# Patient Record
Sex: Female | Born: 1975 | Race: White | Hispanic: No | Marital: Married | Smoking: Never smoker
Health system: Southern US, Community
[De-identification: ages and names within clinical notes are randomized; demographics above are authoritative.]

## PROBLEM LIST (undated history)

## (undated) DIAGNOSIS — H409 Unspecified glaucoma: Secondary | ICD-10-CM

## (undated) DIAGNOSIS — D649 Anemia, unspecified: Secondary | ICD-10-CM

## (undated) HISTORY — DX: Unspecified glaucoma: H40.9

## (undated) HISTORY — PX: KNEE SURGERY: SHX244

---

## 1998-01-23 ENCOUNTER — Other Ambulatory Visit: Admission: RE | Admit: 1998-01-23 | Discharge: 1998-01-23 | Payer: Self-pay | Admitting: Obstetrics and Gynecology

## 1999-02-22 ENCOUNTER — Other Ambulatory Visit: Admission: RE | Admit: 1999-02-22 | Discharge: 1999-02-22 | Payer: Self-pay | Admitting: Family Medicine

## 2000-04-02 ENCOUNTER — Other Ambulatory Visit: Admission: RE | Admit: 2000-04-02 | Discharge: 2000-04-02 | Payer: Self-pay | Admitting: Obstetrics and Gynecology

## 2000-12-12 ENCOUNTER — Inpatient Hospital Stay (HOSPITAL_COMMUNITY): Admission: AD | Admit: 2000-12-12 | Discharge: 2000-12-15 | Payer: Self-pay | Admitting: Obstetrics and Gynecology

## 2001-11-17 ENCOUNTER — Other Ambulatory Visit: Admission: RE | Admit: 2001-11-17 | Discharge: 2001-11-17 | Payer: Self-pay | Admitting: Obstetrics and Gynecology

## 2002-12-30 ENCOUNTER — Other Ambulatory Visit: Admission: RE | Admit: 2002-12-30 | Discharge: 2002-12-30 | Payer: Self-pay | Admitting: Obstetrics and Gynecology

## 2003-03-27 ENCOUNTER — Inpatient Hospital Stay (HOSPITAL_COMMUNITY): Admission: AD | Admit: 2003-03-27 | Discharge: 2003-03-27 | Payer: Self-pay | Admitting: Obstetrics and Gynecology

## 2003-08-03 ENCOUNTER — Inpatient Hospital Stay (HOSPITAL_COMMUNITY): Admission: AD | Admit: 2003-08-03 | Discharge: 2003-08-03 | Payer: Self-pay | Admitting: Obstetrics & Gynecology

## 2003-08-07 ENCOUNTER — Encounter: Admission: RE | Admit: 2003-08-07 | Discharge: 2003-08-07 | Payer: Self-pay | Admitting: Obstetrics & Gynecology

## 2003-08-17 ENCOUNTER — Inpatient Hospital Stay (HOSPITAL_COMMUNITY): Admission: AD | Admit: 2003-08-17 | Discharge: 2003-08-20 | Payer: Self-pay | Admitting: Obstetrics and Gynecology

## 2004-09-28 IMAGING — US US OB COMP EACH ADDL GEST+14 WKS
1 series · 6 of 6 positions shown · non-contrast
Comparison: none

CLINICAL DATA: 19 week twins with vaginal bleeding.

[Series 1: unknown · 0.16mm/px · 6 of 6 slices shown]
[im 1/6]
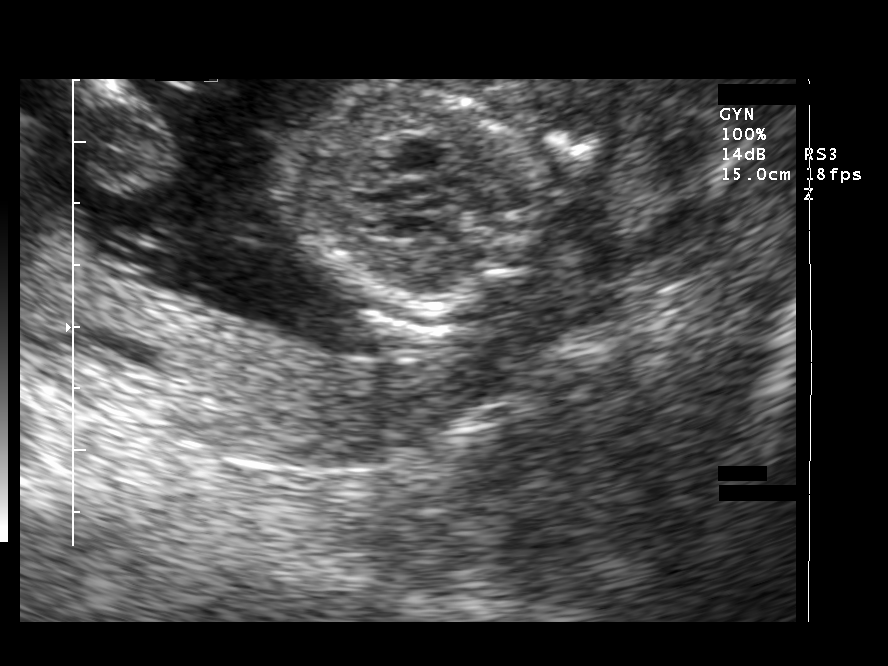
[im 2/6]
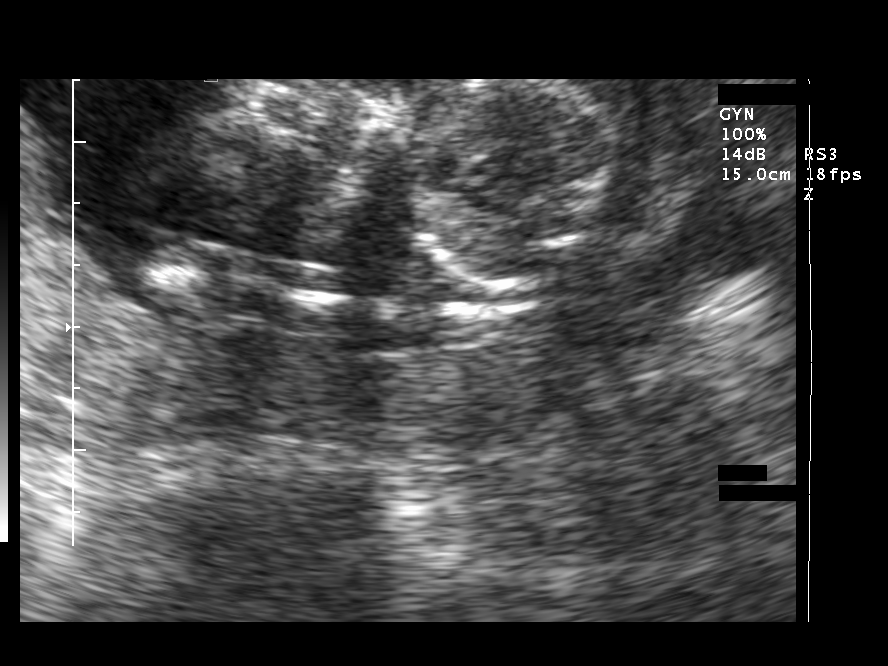
[im 3/6]
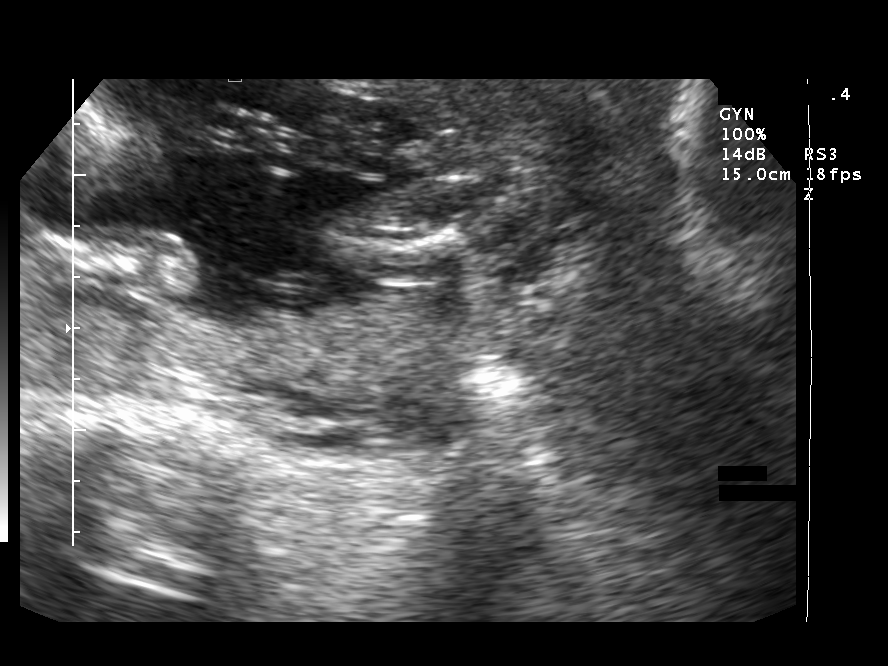
[im 4/6]
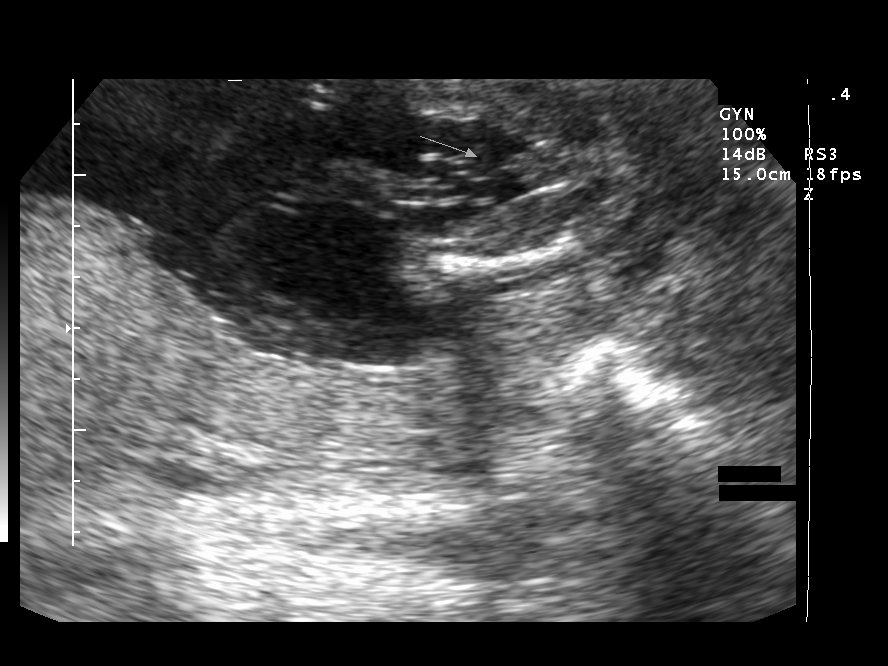
[im 5/6]
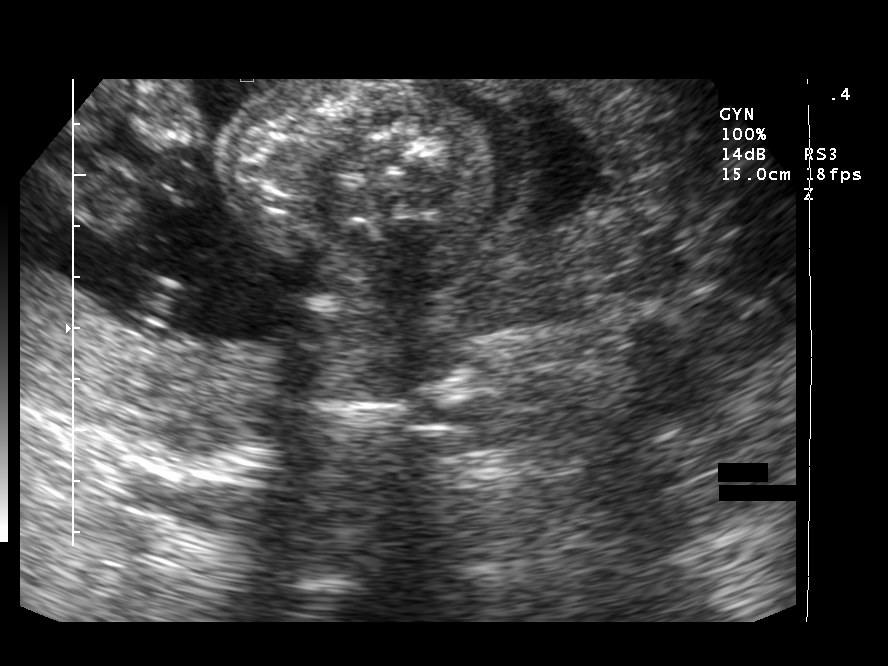
[im 6/6]
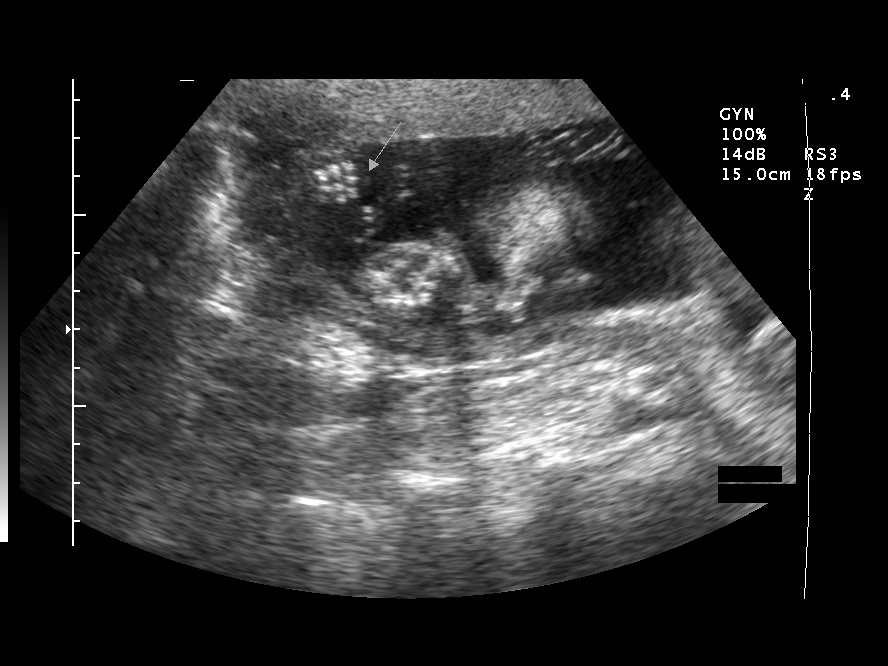

[6 of 6 positions shown; findings below may reference images not displayed]

TWIN OBSTETRICAL ULTRASOUND WITH TRANSVAGINAL EVALUATION
A living twin gestation is present.  A separating membrane is seen.

TWIN A:
Heart Rate:    168 bpm
Movement:  Yes
Breathing:  No
Presentation:  Transverse with head on maternal right, inferior in location.
Placental Location:  Posterior
Comment:  Noted is posterior asymmetric complete placenta previa.
Grade:  I
Previa:  Complete
Amniotic Fluid (Subjective):    Normal  Amniotic Fluid (Objective):   4.4 cm vertical pocket.

FETAL BIOMETRY (TWIN A)
BPD:  4.4 cm    19W  1D
HC:  16.8 cm    19W  3D
AC:  15.1 cm  20W  2D
FL:  3.0 cm  19W  1D
MEAN GA:  19W  4D

FETAL ANATOMY (TWIN A)
Lateral Ventricles:  Visualized 
Thalami/CSP:  Visualized 
Posterior Fossa:  Visualized 
Nuchal Region:  Visualized 
Spine:  Visualized 
4 Chamber Heart on L:  Visualized 
Stomach on Left:  Visualized 
3 Vessel Cord:  Visualized 
Cord Insertion Site:  Visualized    
Kidneys:  Visualized 
Bladder:  Visualized 
Extremities:  Visualized 

Additional Anatomy Visualized:  LVOT, RVOT, upper lip, orbits, profile, diaphragm, heel, 5th digit, ductal arch, aortic arch.

TWIN B:
Heart Rate:  133 bpm
Movement:  Yes  
Breathing:  No
Presentation:  Transverse in presentation with the head on the maternal right, superior in location.
Placental Location:  Anterior
Grade:  I
Previa:  No
Amniotic Fluid (Subjective):  Normal.
Amniotic Fluid (Objective):   3.7 cm vertical pocket

FETAL BIOMETRY (TWIN B)
BPD:  4.5 cm    19W  3D
HC:  16.9 cm    19W  4D
AC:  14.4 cm  19W  5D
FL:  3.0 cm  19W  2D
MEAN GA:  19W  4D

FETAL ANATOMY (TWIN B)
Lateral Ventricles:  Visualized 
Thalami/CSP:  Visualized 
Posterior Fossa:  Visualized 
Nuchal Region:  Visualized 
Spine:  Limited (transverse views only visualized)
4 Chamber Heart on L:  Visualized 
Stomach on Left:  Visualized 
3 Vessel Cord:  Visualized 
Cord Insertion Site:  Visualized   
Kidneys:  Visualized 
Bladder:  Visualized 
Extremities  Visualized 

Additional Anatomy Visualized:  LVOT, RVOT, upper lip, orbits, profile, diaphragm, heel, 5th digit, ductal arch, aortic arch.

Maternal Findings:
Cervix:  4.9 cm transvaginally

IMPRESSION 
Diamniotic dichorionic twin gestation with twin A demonstrating an estimated gestational age by ultrasound of 19 weeks 4 days and twin B of 19 weeks 4 days.  Correlation with assigned gestational age by initial ultrasound of 19 weeks 0 days suggests appropriate and concordant growth.
No focal fetal abnormalities are noted with a good anatomic exam possible on both twins.  Longitudinal views of the fetal spine could not be visualized on twin B due to the positioning, but transverse views appeared within normal limits and with a normal posterior fossa, unless the patient has an elevated AFP value, reassessment is unlikely to be of much diagnostic utility. 
Current posterior asymmetric complete placenta previa associated with twin A. Follow-up at greater than 28 weeks would be recommended for full assessment.

## 2005-02-18 IMAGING — US US OB LIMITED
1 series · 4 of 4 positions shown · non-contrast
Comparison: none

CLINICAL DATA: Known twins.  Question presentation.

 LIMITED OBSTETRICAL ULTRASOUND
 Twin A is located vertex in presentation on the maternal left and demonstrates a cardiac rate of 140 bpm.  Twin B is located transverse in presentation with head on the maternal right and demonstrates a cardiac rate of 146 bpm.

[Series 1: us ob limited · 4 of 4 slices shown]
[im 1/4]
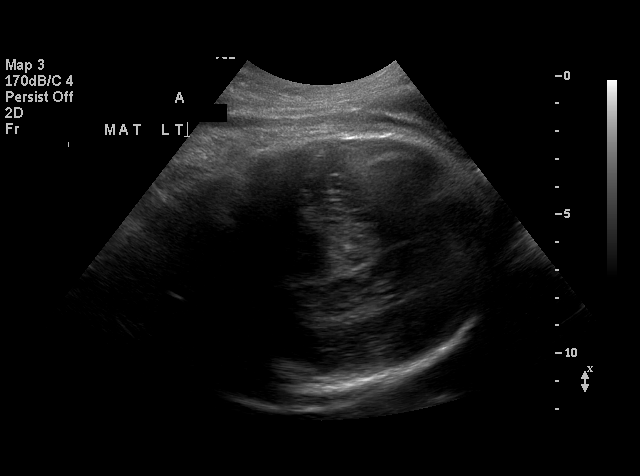
[im 2/4]
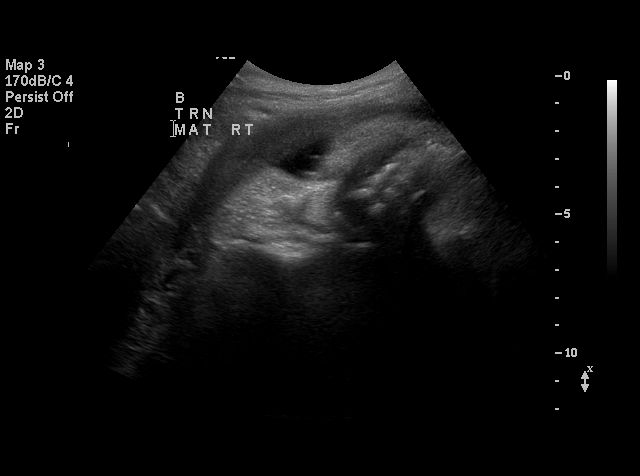
[im 3/4]
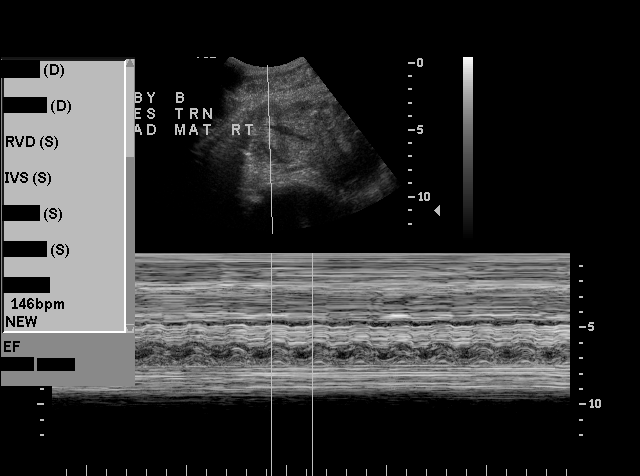
[im 4/4]
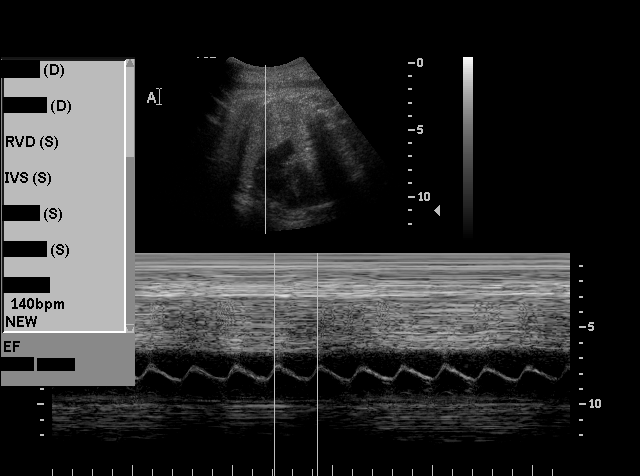

[4 of 4 positions shown; findings below may reference images not displayed]

IMPRESSION: Twin presentation as described above.

## 2007-05-20 HISTORY — PX: TUBAL LIGATION: SHX77

## 2010-06-09 ENCOUNTER — Encounter: Payer: Self-pay | Admitting: Obstetrics & Gynecology

## 2012-09-09 ENCOUNTER — Other Ambulatory Visit: Payer: Self-pay | Admitting: Obstetrics and Gynecology

## 2013-10-27 ENCOUNTER — Other Ambulatory Visit: Payer: Self-pay | Admitting: Obstetrics and Gynecology

## 2013-10-28 LAB — CYTOLOGY - PAP

## 2016-02-24 DIAGNOSIS — J209 Acute bronchitis, unspecified: Secondary | ICD-10-CM | POA: Diagnosis not present

## 2016-06-12 DIAGNOSIS — J111 Influenza due to unidentified influenza virus with other respiratory manifestations: Secondary | ICD-10-CM | POA: Diagnosis not present

## 2016-06-12 DIAGNOSIS — J209 Acute bronchitis, unspecified: Secondary | ICD-10-CM | POA: Diagnosis not present

## 2016-06-20 DIAGNOSIS — J189 Pneumonia, unspecified organism: Secondary | ICD-10-CM | POA: Diagnosis not present

## 2016-10-01 DIAGNOSIS — H40053 Ocular hypertension, bilateral: Secondary | ICD-10-CM | POA: Diagnosis not present

## 2016-10-01 DIAGNOSIS — H5213 Myopia, bilateral: Secondary | ICD-10-CM | POA: Diagnosis not present

## 2017-03-31 DIAGNOSIS — I868 Varicose veins of other specified sites: Secondary | ICD-10-CM | POA: Diagnosis not present

## 2017-03-31 DIAGNOSIS — E559 Vitamin D deficiency, unspecified: Secondary | ICD-10-CM | POA: Diagnosis not present

## 2017-03-31 DIAGNOSIS — Z1389 Encounter for screening for other disorder: Secondary | ICD-10-CM | POA: Diagnosis not present

## 2017-03-31 DIAGNOSIS — Z23 Encounter for immunization: Secondary | ICD-10-CM | POA: Diagnosis not present

## 2017-03-31 DIAGNOSIS — K219 Gastro-esophageal reflux disease without esophagitis: Secondary | ICD-10-CM | POA: Diagnosis not present

## 2017-03-31 DIAGNOSIS — Z0000 Encounter for general adult medical examination without abnormal findings: Secondary | ICD-10-CM | POA: Diagnosis not present

## 2017-04-07 DIAGNOSIS — Z124 Encounter for screening for malignant neoplasm of cervix: Secondary | ICD-10-CM | POA: Diagnosis not present

## 2017-04-07 DIAGNOSIS — Z01419 Encounter for gynecological examination (general) (routine) without abnormal findings: Secondary | ICD-10-CM | POA: Diagnosis not present

## 2017-04-07 DIAGNOSIS — Z6828 Body mass index (BMI) 28.0-28.9, adult: Secondary | ICD-10-CM | POA: Diagnosis not present

## 2017-04-07 DIAGNOSIS — Z1231 Encounter for screening mammogram for malignant neoplasm of breast: Secondary | ICD-10-CM | POA: Diagnosis not present

## 2017-04-13 ENCOUNTER — Other Ambulatory Visit: Payer: Self-pay | Admitting: Obstetrics and Gynecology

## 2017-04-13 DIAGNOSIS — R928 Other abnormal and inconclusive findings on diagnostic imaging of breast: Secondary | ICD-10-CM

## 2017-04-20 ENCOUNTER — Other Ambulatory Visit: Payer: Self-pay | Admitting: Obstetrics and Gynecology

## 2017-04-20 ENCOUNTER — Ambulatory Visit
Admission: RE | Admit: 2017-04-20 | Discharge: 2017-04-20 | Disposition: A | Discharge status: Home / Self Care | Payer: BLUE CROSS/BLUE SHIELD | Source: Ambulatory Visit | Attending: Obstetrics and Gynecology | Admitting: Obstetrics and Gynecology

## 2017-04-20 ENCOUNTER — Ambulatory Visit: Payer: Self-pay

## 2017-04-20 DIAGNOSIS — R928 Other abnormal and inconclusive findings on diagnostic imaging of breast: Secondary | ICD-10-CM | POA: Diagnosis not present

## 2017-04-20 DIAGNOSIS — N6489 Other specified disorders of breast: Secondary | ICD-10-CM

## 2017-05-25 DIAGNOSIS — J324 Chronic pansinusitis: Secondary | ICD-10-CM | POA: Diagnosis not present

## 2017-10-20 ENCOUNTER — Ambulatory Visit
Admission: RE | Admit: 2017-10-20 | Discharge: 2017-10-20 | Disposition: A | Discharge status: Home / Self Care | Payer: BLUE CROSS/BLUE SHIELD | Source: Ambulatory Visit | Attending: Obstetrics and Gynecology | Admitting: Obstetrics and Gynecology

## 2017-10-20 ENCOUNTER — Other Ambulatory Visit: Payer: Self-pay | Admitting: Obstetrics and Gynecology

## 2017-10-20 ENCOUNTER — Encounter: Payer: Self-pay | Admitting: Radiology

## 2017-10-20 DIAGNOSIS — N6489 Other specified disorders of breast: Secondary | ICD-10-CM

## 2017-10-20 DIAGNOSIS — N6310 Unspecified lump in the right breast, unspecified quadrant: Secondary | ICD-10-CM | POA: Diagnosis not present

## 2017-10-20 DIAGNOSIS — N6320 Unspecified lump in the left breast, unspecified quadrant: Secondary | ICD-10-CM | POA: Diagnosis not present

## 2017-10-20 DIAGNOSIS — R928 Other abnormal and inconclusive findings on diagnostic imaging of breast: Secondary | ICD-10-CM | POA: Diagnosis not present

## 2017-11-04 DIAGNOSIS — H5213 Myopia, bilateral: Secondary | ICD-10-CM | POA: Diagnosis not present

## 2017-11-04 DIAGNOSIS — H40053 Ocular hypertension, bilateral: Secondary | ICD-10-CM | POA: Diagnosis not present

## 2018-04-13 DIAGNOSIS — J069 Acute upper respiratory infection, unspecified: Secondary | ICD-10-CM | POA: Diagnosis not present

## 2018-04-13 DIAGNOSIS — J111 Influenza due to unidentified influenza virus with other respiratory manifestations: Secondary | ICD-10-CM | POA: Diagnosis not present

## 2020-10-09 DIAGNOSIS — H40023 Open angle with borderline findings, high risk, bilateral: Secondary | ICD-10-CM | POA: Diagnosis not present

## 2021-10-29 DIAGNOSIS — H40023 Open angle with borderline findings, high risk, bilateral: Secondary | ICD-10-CM | POA: Diagnosis not present

## 2022-05-30 DIAGNOSIS — E559 Vitamin D deficiency, unspecified: Secondary | ICD-10-CM | POA: Diagnosis not present

## 2022-05-30 DIAGNOSIS — Z0000 Encounter for general adult medical examination without abnormal findings: Secondary | ICD-10-CM | POA: Diagnosis not present

## 2022-05-30 DIAGNOSIS — E611 Iron deficiency: Secondary | ICD-10-CM | POA: Diagnosis not present

## 2022-06-10 ENCOUNTER — Encounter (HOSPITAL_COMMUNITY): Payer: Self-pay | Admitting: Internal Medicine

## 2022-06-10 ENCOUNTER — Other Ambulatory Visit: Payer: Self-pay | Admitting: Internal Medicine

## 2022-06-10 ENCOUNTER — Inpatient Hospital Stay: Admission: RE | Admit: 2022-06-10 | Payer: BC Managed Care – PPO | Source: Ambulatory Visit

## 2022-06-10 DIAGNOSIS — Z23 Encounter for immunization: Secondary | ICD-10-CM | POA: Diagnosis not present

## 2022-06-10 DIAGNOSIS — Z1331 Encounter for screening for depression: Secondary | ICD-10-CM | POA: Diagnosis not present

## 2022-06-10 DIAGNOSIS — R109 Unspecified abdominal pain: Secondary | ICD-10-CM

## 2022-06-10 DIAGNOSIS — Z0000 Encounter for general adult medical examination without abnormal findings: Secondary | ICD-10-CM | POA: Diagnosis not present

## 2022-06-10 DIAGNOSIS — E611 Iron deficiency: Secondary | ICD-10-CM | POA: Diagnosis not present

## 2022-06-12 ENCOUNTER — Ambulatory Visit
Admission: RE | Admit: 2022-06-12 | Discharge: 2022-06-12 | Disposition: A | Discharge status: Home / Self Care | Payer: BC Managed Care – PPO | Source: Ambulatory Visit | Attending: Internal Medicine | Admitting: Internal Medicine

## 2022-06-12 DIAGNOSIS — R109 Unspecified abdominal pain: Secondary | ICD-10-CM

## 2022-06-12 MED ORDER — IOPAMIDOL (ISOVUE-300) INJECTION 61%
100.0000 mL | Freq: Once | INTRAVENOUS | Status: AC | PRN
  Administered 2022-06-12: 100 mL via INTRAVENOUS

## 2022-06-19 DIAGNOSIS — D259 Leiomyoma of uterus, unspecified: Secondary | ICD-10-CM | POA: Diagnosis not present

## 2022-06-19 DIAGNOSIS — Z124 Encounter for screening for malignant neoplasm of cervix: Secondary | ICD-10-CM | POA: Diagnosis not present

## 2022-07-23 DIAGNOSIS — D259 Leiomyoma of uterus, unspecified: Secondary | ICD-10-CM | POA: Diagnosis not present

## 2022-07-23 DIAGNOSIS — N920 Excessive and frequent menstruation with regular cycle: Secondary | ICD-10-CM | POA: Diagnosis not present

## 2022-07-23 DIAGNOSIS — Z1231 Encounter for screening mammogram for malignant neoplasm of breast: Secondary | ICD-10-CM | POA: Diagnosis not present

## 2022-09-22 ENCOUNTER — Other Ambulatory Visit: Payer: Self-pay | Admitting: Obstetrics & Gynecology

## 2022-10-07 NOTE — Pre-Procedure Instructions (Signed)
Surgical Instructions    Your procedure is scheduled on Oct 16, 2022.  Report to [facility] Main Entrance "A" at 5:30 A.M., then check in with the Admitting office.  Call this number if you have problems the morning of surgery:  [phone]  If you have any questions prior to your surgery date call [phone]: Open Monday-Friday 8am-4pm If you experience any cold or flu symptoms such as cough, fever, chills, shortness of breath, etc. between now and your scheduled surgery, please notify us at the above number.     Remember:  Do not eat after midnight the night before your surgery  You may drink clear liquids until 4:30 AM the morning of your surgery.   Clear liquids allowed are: Water, Non-Citrus Juices (without pulp), Carbonated Beverages, Clear Tea, Black Coffee Only (NO MILK, CREAM OR POWDERED CREAMER of any kind), and Gatorade.     Take these medicines the morning of surgery with A SIP OF WATER:  MYFEMBREE   acetaminophen (TYLENOL) - may take if needed    As of today, STOP taking any Aspirin (unless otherwise instructed by your surgeon) Aleve, Naproxen, Ibuprofen, Motrin, Advil, Goody's, BC's, all herbal medications, fish oil, and all vitamins.                     Do NOT Smoke (Tobacco/Vaping) for 24 hours prior to your procedure.  If you use a CPAP at night, you may bring your mask/headgear for your overnight stay.   Contacts, glasses, piercing's, hearing aid's, dentures or partials may not be worn into surgery, please bring cases for these belongings.    For patients admitted to the hospital, discharge time will be determined by your treatment team.   Patients discharged the day of surgery will not be allowed to drive home, and someone needs to stay with them for 24 hours.  SURGICAL WAITING ROOM VISITATION Patients having surgery or a procedure may have no more than 2 support people in the waiting area - these visitors may rotate.   Children under the age of 3 must  have an adult with them who is not the patient. If the patient needs to stay at the hospital during part of their recovery, the visitor guidelines for inpatient rooms apply. Pre-op nurse will coordinate an appropriate time for 1 support person to accompany patient in pre-op.  This support person may not rotate.   Please refer to the [facility] website for the visitor guidelines for Inpatients (after your surgery is over and you are in a regular room).    Special instructions:   [facility]- Preparing For Surgery  Before surgery, you can play an important role. Because skin is not sterile, your skin needs to be as free of germs as possible. You can reduce the number of germs on your skin by washing with CHG (chlorahexidine gluconate) Soap before surgery.  CHG is an antiseptic cleaner which kills germs and bonds with the skin to continue killing germs even after washing.    Oral Hygiene is also important to reduce your risk of infection.  Remember - BRUSH YOUR TEETH THE MORNING OF SURGERY WITH YOUR REGULAR TOOTHPASTE  Please do not use if you have an allergy to CHG or antibacterial soaps. If your skin becomes reddened/irritated stop using the CHG.  Do not shave (including legs and underarms) for at least 48 hours prior to first CHG shower. It is OK to shave your face.  Please follow these instructions carefully.  Shower the NIGHT BEFORE SURGERY and the MORNING OF SURGERY  If you chose to wash your hair, wash your hair first as usual with your normal shampoo.  After you shampoo, rinse your hair and body thoroughly to remove the shampoo.  Use CHG Soap as you would any other liquid soap. You can apply CHG directly to the skin and wash gently with a scrungie or a clean washcloth.   Apply the CHG Soap to your body ONLY FROM THE NECK DOWN.  Do not use on open wounds or open sores. Avoid contact with your eyes, ears, mouth and genitals (private parts). Wash Face and genitals (private parts)  with  your normal soap.   Wash thoroughly, paying special attention to the area where your surgery will be performed.  Thoroughly rinse your body with warm water from the neck down.  DO NOT shower/wash with your normal soap after using and rinsing off the CHG Soap.  Pat yourself dry with a CLEAN TOWEL.  Wear CLEAN PAJAMAS to bed the night before surgery  Place CLEAN SHEETS on your bed the night before your surgery  DO NOT SLEEP WITH PETS.   Day of Surgery: Take a shower with CHG soap. Do not wear jewelry or makeup Do not wear lotions, powders, perfumes/colognes, or deodorant. Do not shave 48 hours prior to surgery.  Men may shave face and neck. Do not bring valuables to the hospital.  [facility] is not responsible for any belongings or valuables. Do not wear nail polish, gel polish, artificial nails, or any other type of covering on natural nails (fingers and toes) If you have artificial nails or gel coating that need to be removed by a nail salon, please have this removed prior to surgery. Artificial nails or gel coating may interfere with anesthesia's ability to adequately monitor your vital signs.  Wear Clean/Comfortable clothing the morning of surgery Remember to brush your teeth WITH YOUR REGULAR TOOTHPASTE.   Please read over the following fact sheets that you were given.    If you received a COVID test during your pre-op visit  it is requested that you wear a mask when out in public, stay away from anyone that may not be feeling well and notify your surgeon if you develop symptoms. If you have been in contact with anyone that has tested positive in the last 10 days please notify you surgeon.

## 2022-10-08 ENCOUNTER — Encounter (HOSPITAL_COMMUNITY)
Admission: RE | Admit: 2022-10-08 | Discharge: 2022-10-08 | Disposition: A | Discharge status: Home / Self Care | Payer: BC Managed Care – PPO | Source: Ambulatory Visit | Attending: Obstetrics & Gynecology | Admitting: Obstetrics & Gynecology

## 2022-10-08 ENCOUNTER — Encounter (HOSPITAL_COMMUNITY): Payer: Self-pay

## 2022-10-08 ENCOUNTER — Other Ambulatory Visit: Payer: Self-pay

## 2022-10-08 DIAGNOSIS — Z01812 Encounter for preprocedural laboratory examination: Secondary | ICD-10-CM | POA: Insufficient documentation

## 2022-10-08 HISTORY — DX: Anemia, unspecified: D64.9

## 2022-10-08 LAB — BASIC METABOLIC PANEL
Anion gap: 8 (ref 5–15)
BUN: 15 mg/dL (ref 6–20)
CO2: 25 mmol/L (ref 22–32)
Calcium: 9.9 mg/dL (ref 8.9–10.3)
Chloride: 104 mmol/L (ref 98–111)
Creatinine, Ser: 0.43 mg/dL — ABNORMAL LOW (ref 0.44–1.00)
GFR, Estimated: >60 mL/min (ref >60)
Glucose, Bld: 101 mg/dL — ABNORMAL HIGH (ref 70–99)
Potassium: 4.2 mmol/L (ref 3.5–5.1)
Sodium: 137 mmol/L (ref 135–145)

## 2022-10-08 LAB — CBC
HCT: 39.2 % (ref 36.0–46.0)
Hemoglobin: 12.6 g/dL (ref 12.0–15.0)
MCH: 29.4 pg (ref 26.0–34.0)
MCHC: 32.1 g/dL (ref 30.0–36.0)
MCV: 91.6 fL (ref 80.0–100.0)
Platelets: 277 10*3/uL (ref 150–400)
RBC: 4.28 MIL/uL (ref 3.87–5.11)
RDW: 12.6 % (ref 11.5–15.5)
WBC: 5.9 10*3/uL (ref 4.0–10.5)
nRBC: 0 % (ref 0.0–0.2)

## 2022-10-08 LAB — TYPE AND SCREEN
ABO/RH(D): A POS
Antibody Screen: NEGATIVE

## 2022-10-08 NOTE — Progress Notes (Signed)
PCP - Dr. Rodrigo Ran Cardiologist - Denies  PPM/ICD - Denies Device Orders - n/a Rep Notified - n/a  Chest x-ray - n/a EKG - Denies Stress Test - Denies ECHO - Denies Cardiac Cath - Denies  Sleep Study - Denies CPAP - n/a  No DM  Last dose of GLP1 agonist- n/a GLP1 instructions: n/a  Blood Thinner Instructions: n/a Aspirin Instructions: n/a  ERAS Protcol - Clear liquids until 0430 morning of surgery PRE-SURGERY Ensure or G2- n/a  COVID TEST- n/a   Anesthesia review: No   Patient denies shortness of breath, fever, cough and chest pain at PAT appointment. Pt denies any respiratory illness/infection in the last two months.   All instructions explained to the patient, with a verbal understanding of the material. Patient agrees to go over the instructions while at home for a better understanding. Patient also instructed to self quarantine after being tested for COVID-19. The opportunity to ask questions was provided.

## 2022-10-15 NOTE — H&P (Incomplete)
Kathleen Bender is an 47 y.o. female. Here for abdominal hysterectomy for large symptomatic fibroid uterus, upto her umbilicus. She has been on Myfembree that has helped with pressure changes and menorrhagia and pain.   Pt was referred by her PCP after routine exam noted abdominal mass and CT was ordered, that noted 16x10.9 cm uterus with multiple myomas, largest at 8 cm, nl ovaries. No hydronephrosis.   She says she noted some pain and discomfort and pants not getting lose despite weight loss and on further questioning informed her menses were getting heavier and painful and lasting longer. She works on her dailry farm, is very active and still able to carry out most work but sometimes has to lay down due to cramps in menses.  Prior Paps normal. We did one at visit in Feb'24, prior to that was in 2018 w/ Dr Tenny Craw who has retired and she had not found any new Gyn. No breast complaints, last mammogr 2018 and recent with our office in Feb'24 She is married.. G2P3, 1 SVD, then twins C/s, who are 26- all boys  PCP did health labs- nl.   Patient's last menstrual period was 08/19/2022 (approximate).    Past Medical History:  Diagnosis Date   Anemia    Iron Deficiency r/t fibroids    Past Surgical History:  Procedure Laterality Date   CESAREAN SECTION  2009   KNEE SURGERY     46 years old   TUBAL LIGATION  2009   No family history on file.  Social History:  reports that she has never smoked. She has never used smokeless tobacco. She reports that she does not drink alcohol and does not use drugs.  Allergies: No Known Allergies  No medications prior to admission.   Review of Systems neg   Physical Exam BP 130/78   Pulse 77   Temp 99.4 F (37.4 C)   Resp 18   Ht 5\' 7"  (1.702 m)   Wt 68.5 kg   LMP 08/19/2022 (Approximate)   SpO2 98%   BMI 23.65 kg/m   A&O x 3, no acute distress. Pleasant HEENT neg, no thyromegaly Lungs CTA bilat CV RRR, S1S2 normal Abdo soft, non tender, non  acute Extr no edema/ tenderness Pelvic Uterus enlarged 18 wks, mobile, seems slightly smaller since on Myfembree   Assessment/Plan: Very large fibroid uterus with symptoms of pain, menorrhagia. We reviewed options- Hysterectomy (TAH due to size ) or uterine fibroid embolization with some mass effect will remain since takes time to decrease size. R/b/c of both dw pt and she is desiring hysterectomy As for heavy bleeding, she was given Myfembree and as helped that and somewhat size reduction as well   Risks/complications of surgery reviewed incl infection, bleeding, damage to internal organs including bladder, bowels, ureters, blood vessels, other risks from anesthesia, VTE and delayed complications of any surgery, complications in future surgery reviewed. Also discussed neonatal complications incl difficult delivery, laceration, vacuum assistance, TTN etc. Pt understands and agrees, all concerns addressed.     Robley Fries 10/16/22, 7 AM

## 2022-10-15 NOTE — Anesthesia Preprocedure Evaluation (Addendum)
Anesthesia Evaluation  Patient identified by MRN, date of birth, ID band Patient awake    Reviewed: Allergy & Precautions, NPO status , Patient's Chart, lab work & pertinent test results  History of Anesthesia Complications Negative for: history of anesthetic complications  Airway Mallampati: II  TM Distance: >3 FB Neck ROM: Full    Dental  (+) Dental Advisory Given   Pulmonary neg pulmonary ROS   Pulmonary exam normal breath sounds clear to auscultation       Cardiovascular negative cardio ROS  Rhythm:Regular Rate:Normal     Neuro/Psych negative neurological ROS     GI/Hepatic negative GI ROS, Neg liver ROS,,,  Endo/Other  negative endocrine ROS    Renal/GU negative Renal ROS     Musculoskeletal   Abdominal   Peds  Hematology  (+) Blood dyscrasia, anemia   Anesthesia Other Findings   Reproductive/Obstetrics                             Anesthesia Physical Anesthesia Plan  ASA: 2  Anesthesia Plan: General   Post-op Pain Management: Regional block* and Tylenol PO (pre-op)*   Induction: Intravenous  PONV Risk Score and Plan: 3 and Ondansetron, Dexamethasone, Midazolam, Scopolamine patch - Pre-op and Treatment may vary due to age or medical condition  Airway Management Planned: Oral ETT  Additional Equipment:   Intra-op Plan:   Post-operative Plan: Extubation in OR  Informed Consent: I have reviewed the patients History and Physical, chart, labs and discussed the procedure including the risks, benefits and alternatives for the proposed anesthesia with the patient or authorized representative who has indicated his/her understanding and acceptance.     Dental advisory given  Plan Discussed with: Anesthesiologist and CRNA  Anesthesia Plan Comments: (Risks of general anesthesia discussed including, but not limited to, sore throat, hoarse voice, chipped/damaged teeth, injury to  vocal cords, nausea and vomiting, allergic reactions, lung infection, heart attack, stroke, and death. All questions answered. )       Anesthesia Quick Evaluation

## 2022-10-16 ENCOUNTER — Other Ambulatory Visit: Payer: Self-pay

## 2022-10-16 ENCOUNTER — Inpatient Hospital Stay (HOSPITAL_COMMUNITY): Payer: BC Managed Care – PPO | Admitting: Anesthesiology

## 2022-10-16 ENCOUNTER — Encounter (HOSPITAL_COMMUNITY): Payer: Self-pay | Admitting: Obstetrics & Gynecology

## 2022-10-16 ENCOUNTER — Encounter (HOSPITAL_COMMUNITY)
Admission: RE | Disposition: A | Discharge status: Home / Self Care | Payer: Self-pay | Source: Home / Self Care | Attending: Obstetrics & Gynecology

## 2022-10-16 ENCOUNTER — Inpatient Hospital Stay (HOSPITAL_COMMUNITY)
Admission: RE | Admit: 2022-10-16 | Discharge: 2022-10-17 | DRG: 743 | Disposition: A | Discharge status: Home / Self Care | Payer: BC Managed Care – PPO | Attending: Obstetrics & Gynecology | Admitting: Obstetrics & Gynecology

## 2022-10-16 DIAGNOSIS — D259 Leiomyoma of uterus, unspecified: Principal | ICD-10-CM | POA: Diagnosis present

## 2022-10-16 DIAGNOSIS — D219 Benign neoplasm of connective and other soft tissue, unspecified: Principal | ICD-10-CM | POA: Diagnosis present

## 2022-10-16 DIAGNOSIS — G8918 Other acute postprocedural pain: Secondary | ICD-10-CM | POA: Diagnosis not present

## 2022-10-16 DIAGNOSIS — N920 Excessive and frequent menstruation with regular cycle: Secondary | ICD-10-CM | POA: Diagnosis present

## 2022-10-16 DIAGNOSIS — Z90710 Acquired absence of both cervix and uterus: Secondary | ICD-10-CM | POA: Diagnosis present

## 2022-10-16 DIAGNOSIS — N736 Female pelvic peritoneal adhesions (postinfective): Secondary | ICD-10-CM | POA: Diagnosis not present

## 2022-10-16 HISTORY — PX: HYSTERECTOMY ABDOMINAL WITH SALPINGECTOMY: SHX6725

## 2022-10-16 SURGERY — HYSTERECTOMY, TOTAL, ABDOMINAL, WITH SALPINGECTOMY
Anesthesia: General | Site: Abdomen | Laterality: Bilateral

## 2022-10-16 MED ORDER — ROCURONIUM BROMIDE 10 MG/ML (PF) SYRINGE
PREFILLED_SYRINGE | INTRAVENOUS | Status: AC
  Filled 2022-10-16: qty 10

## 2022-10-16 MED ORDER — AMISULPRIDE (ANTIEMETIC) 5 MG/2ML IV SOLN
10.0000 mg | Freq: Once | INTRAVENOUS | Status: AC | PRN
  Administered 2022-10-16: 10 mg via INTRAVENOUS

## 2022-10-16 MED ORDER — ACETAMINOPHEN 500 MG PO TABS
ORAL_TABLET | ORAL | Status: AC
  Administered 2022-10-16: 1000 mg via ORAL
  Filled 2022-10-16: qty 2

## 2022-10-16 MED ORDER — SIMETHICONE 80 MG PO CHEW: 80.0000 mg | CHEWABLE_TABLET | Freq: Four times a day (QID) | ORAL | Status: DC | PRN

## 2022-10-16 MED ORDER — LIDOCAINE 2% (20 MG/ML) 5 ML SYRINGE
INTRAMUSCULAR | Status: DC | PRN
  Administered 2022-10-16: 80 mg via INTRAVENOUS

## 2022-10-16 MED ORDER — LACTATED RINGERS IV SOLN: INTRAVENOUS | Status: DC | PRN

## 2022-10-16 MED ORDER — POVIDONE-IODINE 10 % EX SWAB
2.0000 | Freq: Once | CUTANEOUS | Status: AC
  Administered 2022-10-16: 2 via TOPICAL

## 2022-10-16 MED ORDER — OXYCODONE HCL 5 MG PO TABS: 5.0000 mg | ORAL_TABLET | Freq: Once | ORAL | Status: DC | PRN

## 2022-10-16 MED ORDER — OXYCODONE HCL 5 MG/5ML PO SOLN: 5.0000 mg | Freq: Once | ORAL | Status: DC | PRN

## 2022-10-16 MED ORDER — KETOROLAC TROMETHAMINE 30 MG/ML IJ SOLN
30.0000 mg | Freq: Four times a day (QID) | INTRAMUSCULAR | Status: AC
  Administered 2022-10-16 – 2022-10-17 (×4): 30 mg via INTRAVENOUS
  Filled 2022-10-16 (×4): qty 1

## 2022-10-16 MED ORDER — LACTATED RINGERS IV SOLN: INTRAVENOUS | Status: DC

## 2022-10-16 MED ORDER — ONDANSETRON HCL 4 MG/2ML IJ SOLN
INTRAMUSCULAR | Status: DC | PRN
  Administered 2022-10-16: 4 mg via INTRAVENOUS

## 2022-10-16 MED ORDER — DEXAMETHASONE SODIUM PHOSPHATE 4 MG/ML IJ SOLN
INTRAMUSCULAR | Status: DC | PRN
  Administered 2022-10-16: 10 mg via INTRAVENOUS

## 2022-10-16 MED ORDER — MIDAZOLAM HCL 5 MG/5ML IJ SOLN
INTRAMUSCULAR | Status: DC | PRN
  Administered 2022-10-16: 2 mg via INTRAVENOUS

## 2022-10-16 MED ORDER — OXYCODONE HCL 5 MG PO TABS
5.0000 mg | ORAL_TABLET | ORAL | Status: DC | PRN
  Administered 2022-10-16 – 2022-10-17 (×2): 5 mg via ORAL
  Filled 2022-10-16 (×2): qty 1

## 2022-10-16 MED ORDER — SCOPOLAMINE 1 MG/3DAYS TD PT72
MEDICATED_PATCH | TRANSDERMAL | Status: AC
  Administered 2022-10-16: 1.5 mg via TRANSDERMAL
  Filled 2022-10-16: qty 1

## 2022-10-16 MED ORDER — EPHEDRINE SULFATE-NACL 50-0.9 MG/10ML-% IV SOSY
PREFILLED_SYRINGE | INTRAVENOUS | Status: DC | PRN
  Administered 2022-10-16 (×3): 5 mg via INTRAVENOUS

## 2022-10-16 MED ORDER — ONDANSETRON HCL 4 MG/2ML IJ SOLN
INTRAMUSCULAR | Status: AC
  Filled 2022-10-16: qty 2

## 2022-10-16 MED ORDER — BUPIVACAINE HCL (PF) 0.25 % IJ SOLN
INTRAMUSCULAR | Status: AC
  Filled 2022-10-16: qty 30

## 2022-10-16 MED ORDER — MIDAZOLAM HCL 2 MG/2ML IJ SOLN
INTRAMUSCULAR | Status: AC
  Filled 2022-10-16: qty 2

## 2022-10-16 MED ORDER — AMISULPRIDE (ANTIEMETIC) 5 MG/2ML IV SOLN
INTRAVENOUS | Status: AC
  Filled 2022-10-16: qty 4

## 2022-10-16 MED ORDER — DEXMEDETOMIDINE HCL IN NACL 80 MCG/20ML IV SOLN
INTRAVENOUS | Status: DC | PRN
  Administered 2022-10-16: 12 ug via INTRAVENOUS
  Administered 2022-10-16: 4 ug via INTRAVENOUS

## 2022-10-16 MED ORDER — HYDROMORPHONE HCL 1 MG/ML IJ SOLN: 0.2500 mg | INTRAMUSCULAR | Status: DC | PRN

## 2022-10-16 MED ORDER — FENTANYL CITRATE (PF) 250 MCG/5ML IJ SOLN
INTRAMUSCULAR | Status: AC
  Filled 2022-10-16: qty 5

## 2022-10-16 MED ORDER — ONDANSETRON HCL 4 MG PO TABS: 4.0000 mg | ORAL_TABLET | Freq: Four times a day (QID) | ORAL | Status: DC | PRN

## 2022-10-16 MED ORDER — BUPIVACAINE HCL (PF) 0.25 % IJ SOLN
INTRAMUSCULAR | Status: DC | PRN
  Administered 2022-10-16: 20 mL

## 2022-10-16 MED ORDER — DEXAMETHASONE SODIUM PHOSPHATE 10 MG/ML IJ SOLN
INTRAMUSCULAR | Status: AC
  Filled 2022-10-16: qty 1

## 2022-10-16 MED ORDER — HYDROMORPHONE HCL 1 MG/ML IJ SOLN: 0.5000 mg | INTRAMUSCULAR | Status: DC | PRN

## 2022-10-16 MED ORDER — CHLORHEXIDINE GLUCONATE 0.12 % MT SOLN
OROMUCOSAL | Status: AC
  Filled 2022-10-16: qty 15

## 2022-10-16 MED ORDER — KETAMINE HCL 10 MG/ML IJ SOLN
INTRAMUSCULAR | Status: DC | PRN
  Administered 2022-10-16: 30 mg via INTRAVENOUS

## 2022-10-16 MED ORDER — IBUPROFEN 600 MG PO TABS
600.0000 mg | ORAL_TABLET | Freq: Four times a day (QID) | ORAL | Status: DC
  Administered 2022-10-17: 600 mg via ORAL
  Filled 2022-10-16: qty 1

## 2022-10-16 MED ORDER — PHENYLEPHRINE 80 MCG/ML (10ML) SYRINGE FOR IV PUSH (FOR BLOOD PRESSURE SUPPORT)
PREFILLED_SYRINGE | INTRAVENOUS | Status: DC | PRN
  Administered 2022-10-16: 80 ug via INTRAVENOUS

## 2022-10-16 MED ORDER — BUPIVACAINE HCL (PF) 0.25 % IJ SOLN
INTRAMUSCULAR | Status: DC | PRN
  Administered 2022-10-16 (×2): 30 mL via PERINEURAL

## 2022-10-16 MED ORDER — 0.9 % SODIUM CHLORIDE (POUR BTL) OPTIME
TOPICAL | Status: DC | PRN
  Administered 2022-10-16: 2000 mL

## 2022-10-16 MED ORDER — FENTANYL CITRATE (PF) 100 MCG/2ML IJ SOLN
INTRAMUSCULAR | Status: DC | PRN
  Administered 2022-10-16 (×5): 50 ug via INTRAVENOUS

## 2022-10-16 MED ORDER — CEFAZOLIN SODIUM-DEXTROSE 2-4 GM/100ML-% IV SOLN
INTRAVENOUS | Status: AC
  Filled 2022-10-16: qty 100

## 2022-10-16 MED ORDER — PROPOFOL 10 MG/ML IV BOLUS
INTRAVENOUS | Status: DC | PRN
  Administered 2022-10-16: 150 mg via INTRAVENOUS

## 2022-10-16 MED ORDER — ACETAMINOPHEN 500 MG PO TABS: 1000.0000 mg | ORAL_TABLET | Freq: Once | ORAL | Status: AC

## 2022-10-16 MED ORDER — PROPOFOL 10 MG/ML IV BOLUS
INTRAVENOUS | Status: AC
  Filled 2022-10-16: qty 20

## 2022-10-16 MED ORDER — ROCURONIUM BROMIDE 10 MG/ML (PF) SYRINGE
PREFILLED_SYRINGE | INTRAVENOUS | Status: DC | PRN
  Administered 2022-10-16: 20 mg via INTRAVENOUS
  Administered 2022-10-16: 50 mg via INTRAVENOUS
  Administered 2022-10-16: 10 mg via INTRAVENOUS

## 2022-10-16 MED ORDER — KETAMINE HCL 50 MG/5ML IJ SOSY
PREFILLED_SYRINGE | INTRAMUSCULAR | Status: AC
  Filled 2022-10-16: qty 5

## 2022-10-16 MED ORDER — LIDOCAINE 2% (20 MG/ML) 5 ML SYRINGE
INTRAMUSCULAR | Status: AC
  Filled 2022-10-16: qty 5

## 2022-10-16 MED ORDER — SCOPOLAMINE 1 MG/3DAYS TD PT72
1.0000 | MEDICATED_PATCH | TRANSDERMAL | Status: DC
  Administered 2022-10-16: 1 via TRANSDERMAL

## 2022-10-16 MED ORDER — ONDANSETRON HCL 4 MG/2ML IJ SOLN
4.0000 mg | Freq: Four times a day (QID) | INTRAMUSCULAR | Status: DC | PRN
  Administered 2022-10-17: 4 mg via INTRAVENOUS
  Filled 2022-10-16: qty 2

## 2022-10-16 MED ORDER — MENTHOL 3 MG MT LOZG: 1.0000 | LOZENGE | OROMUCOSAL | Status: DC | PRN

## 2022-10-16 MED ORDER — CEFAZOLIN SODIUM-DEXTROSE 2-4 GM/100ML-% IV SOLN
2.0000 g | INTRAVENOUS | Status: AC
  Administered 2022-10-16: 2 g via INTRAVENOUS

## 2022-10-16 MED ORDER — ACETAMINOPHEN 325 MG PO TABS: 650.0000 mg | ORAL_TABLET | ORAL | Status: DC | PRN

## 2022-10-16 MED ORDER — PHENYLEPHRINE 80 MCG/ML (10ML) SYRINGE FOR IV PUSH (FOR BLOOD PRESSURE SUPPORT)
PREFILLED_SYRINGE | INTRAVENOUS | Status: AC
  Filled 2022-10-16: qty 10

## 2022-10-16 MED ORDER — SUGAMMADEX SODIUM 200 MG/2ML IV SOLN
INTRAVENOUS | Status: DC | PRN
  Administered 2022-10-16: 200 mg via INTRAVENOUS

## 2022-10-16 SURGICAL SUPPLY — 40 items
APL SKNCLS STERI-STRIP NONHPOA (GAUZE/BANDAGES/DRESSINGS) ×1
BAG COUNTER SPONGE SURGICOUNT (BAG) ×1 IMPLANT
BAG SPNG CNTER NS LX DISP (BAG) ×1
BENZOIN TINCTURE PRP APPL 2/3 (GAUZE/BANDAGES/DRESSINGS) IMPLANT
CANISTER SUCT 3000ML PPV (MISCELLANEOUS) ×1 IMPLANT
CLSR STERI-STRIP ANTIMIC 1/2X4 (GAUZE/BANDAGES/DRESSINGS) IMPLANT
DRAPE CESAREAN BIRTH W POUCH (DRAPES) ×1 IMPLANT
DRAPE WARM FLUID 44X44 (DRAPES) IMPLANT
DRSG OPSITE POSTOP 4X10 (GAUZE/BANDAGES/DRESSINGS) ×1 IMPLANT
DURAPREP 26ML APPLICATOR (WOUND CARE) ×1 IMPLANT
GAUZE 4X4 16PLY ~~LOC~~+RFID DBL (SPONGE) IMPLANT
GLOVE BIO SURGEON STRL SZ7 (GLOVE) ×1 IMPLANT
GLOVE BIOGEL PI IND STRL 6.5 (GLOVE) IMPLANT
GLOVE ECLIPSE 6.0 STRL STRAW (GLOVE) IMPLANT
GLOVE SURG UNDER POLY LF SZ7 (GLOVE) ×3 IMPLANT
GOWN STRL REUS W/ TWL LRG LVL3 (GOWN DISPOSABLE) ×3 IMPLANT
GOWN STRL REUS W/TWL LRG LVL3 (GOWN DISPOSABLE) ×5
HIBICLENS CHG 4% 4OZ BTL (MISCELLANEOUS) ×1 IMPLANT
KIT TURNOVER KIT B (KITS) ×1 IMPLANT
LIGASURE IMPACT 36 18CM CVD LR (INSTRUMENTS) IMPLANT
NDL HYPO 22X1.5 SAFETY MO (MISCELLANEOUS) ×1 IMPLANT
NEEDLE HYPO 22X1.5 SAFETY MO (MISCELLANEOUS) ×1 IMPLANT
NS IRRIG 1000ML POUR BTL (IV SOLUTION) ×1 IMPLANT
PACK ABDOMINAL GYN (CUSTOM PROCEDURE TRAY) ×1 IMPLANT
PAD ARMBOARD 7.5X6 YLW CONV (MISCELLANEOUS) ×1 IMPLANT
PAD OB MATERNITY 4.3X12.25 (PERSONAL CARE ITEMS) ×1 IMPLANT
PENCIL SMOKE EVACUATOR (MISCELLANEOUS) ×1 IMPLANT
SPONGE T-LAP 18X18 ~~LOC~~+RFID (SPONGE) ×2 IMPLANT
SUT PLAIN 2 0 XLH (SUTURE) IMPLANT
SUT PROLENE 0 CT 1 30 (SUTURE) IMPLANT
SUT VIC AB 0 CT1 18XCR BRD8 (SUTURE) ×3 IMPLANT
SUT VIC AB 0 CT1 36 (SUTURE) ×4 IMPLANT
SUT VIC AB 0 CT1 8-18 (SUTURE) ×2
SUT VIC AB 2-0 CT1 36 (SUTURE) IMPLANT
SUT VIC AB 4-0 KS 27 (SUTURE) IMPLANT
SUT VIC AB 4-0 SH 18 (SUTURE) IMPLANT
SUT VICRYL 0 TIES 12 18 (SUTURE) ×1 IMPLANT
SYR CONTROL 10ML LL (SYRINGE) ×1 IMPLANT
TOWEL GREEN STERILE FF (TOWEL DISPOSABLE) ×2 IMPLANT
TRAY FOLEY W/BAG SLVR 14FR (SET/KITS/TRAYS/PACK) ×1 IMPLANT

## 2022-10-16 NOTE — Transfer of Care (Signed)
Immediate Anesthesia Transfer of Care Note  Patient: Terina W Mcvicar  Procedure(s) Performed: HYSTERECTOMY ABDOMINAL WITH SALPINGECTOMY (Bilateral: Abdomen)  Patient Location: PACU  Anesthesia Type:General  Level of Consciousness: awake and alert   Airway & Oxygen Therapy: Patient Spontanous Breathing and Patient connected to nasal cannula oxygen  Post-op Assessment: Report given to RN and Post -op Vital signs reviewed and stable  Post vital signs: Reviewed and stable  Last Vitals:  Vitals Value Taken Time  BP 121/83 10/16/22 1000  Temp    Pulse 64 10/16/22 1005  Resp 12 10/16/22 1005  SpO2 97 % 10/16/22 1005  Vitals shown include unvalidated device data.  Last Pain:  Vitals:   10/16/22 0736  PainSc: 0-No pain         Complications: No notable events documented.

## 2022-10-16 NOTE — Op Note (Signed)
10/16/2022 Kathleen Bender December 27, 1975  PRE-OPERATIVE DIAGNOSIS:  Uterine Fibroids    POST-OPERATIVE DIAGNOSIS:  Uterine Fibroids   PROCEDURE:  TOTAL ABDOMINAL HYSTERECTOMY, BILATERAL ALPINGECTOMY  TAP block by anesthesiologist   SURGEON: Robley Fries, MD  ASSISTANT:  Rhoderick Moody, MD   ANESTHESIA:  General endotracheal  EBL: 50 cc  IVF: LR 1 liter  Urine output: foley 500 cc clear   BLOOD ADMINISTERED: none  LOCAL MEDICATIONS USED:  MARCAINE 0.25% 10 cc skin infiltration     SPECIMEN: Uterus, cervix and both fallopian tubes    DISPOSITION OF SPECIMEN:  PATHOLOGY  COUNTS:  YES  PATIENT DISPOSITION:  PACU - hemodynamically stable. Admit for post-op care.   Delay start of Pharmacological VTE agent (>24hrs) due to surgical blood loss or risk of bleeding: yes  PROCEDURE:   Indication:  Symptomatic uterine fibroids with abdominal mass, pain and menorrhagia.. Risks and complications of surgery including infection, bleeding, damage to internal organs and other including but not limited to surgery related problems including pneumonia, VTE reviewed. Informed written consent was obtained.   Patient was brought to the operating room with IV running. She received 2 gm Ancef. Underwent general anesthesia without difficulty and was given dorsal supine position, prepped and draped in sterile fashion. Foley catheter was placed. Exam under anesthesia noted uterus at the umbilicus but mobile. Pfannenstiel incision was made via her c-section scar with scalpel and carried down to the underlying fascia with Bovie with excellent hemostasis.  Fascia incised and extended laterally. Fascia grasped with Kocher's and underlying rectus muscles were dissected down. Rectus muscles were separated in midline. Posterior rectus sheath and posterior peritoneum was grasped with mosquitoes and peritoneal entry made. Large fibroid uterus was palpated. Filmy adhesions of left IP ligament to colon fat noted.  Dissection done with cold scissors and adhesions removed. Left round ligament desiccated and cut. Broad ligament dissected and incised. Left salpingectomy done with Ligasure. Window created in posterior broad ligament and utero-ovarian ligament clamped, desiccated and cut with Ligasure.  Uterus was now delivered out of the incision with manipulation and using towel clamps. Right round ligament grasped, desiccated and incised with Ligasure. Anterior broad ligament was dissected and cut to creat bladder flap. Bladder was pushed further away with blunt dissection with sponge. Right fallopian tube desiccated and cut with Ligasure. A window was created in right posterior broad ligament and right utero-ovarian ligament was clamped and cut with Ligasure after desiccation. Hemostasis excellent. Posterior broad ligament was dissected bilaterally and uterine vessels were skeletonized. Bilateral uterine vessels were clamped, desiccated and cut with Ligasure and straight Heaney clamps placed inside hugging the uterus. Uterus was amputated above the cervix and passed off and cervical stump was grasped with Kochers. Hemostasis was observed. Ligasure used to dessicate and cut Cardinal ligaments. Bilateral curved Heaney's applied to vaginal angles with Uterosacral ligament and cut and transfixed with 0-Vicryl pop-offs. Vaginal opening noted, was cut circumferentially. Vaginal cut was closed with interrupted figure of eight stitches with 0 Vicryl Excellent hemostasis noted.  All pedicles appeared dry. Ovarian pedicles looked hemostatic. All instruments and pack removed. Peritoneal edges grasped and peritoneum closed with 2-0 Vicryl. Fascia sutured with 0 Vicryl from two ends and met in midline. .20 cc -.25% Marcaine injected in skin.  Skin approximated with 4-0 Vicryl in subcuticular fashion.  Sterile dressing placed.  Sterile Honeycomb dressing placed.  All  Instruments/ lap/ sponges counts were correct x2. No complications.  Dr  Juliene Pina was the surgeon for entire case.

## 2022-10-16 NOTE — Anesthesia Procedure Notes (Signed)
Procedure Name: Intubation Date/Time: 10/16/2022 7:41 AM  Performed by: Caren Macadam, CRNAPre-anesthesia Checklist: Patient identified, Emergency Drugs available, Suction available and Patient being monitored Patient Re-evaluated:Patient Re-evaluated prior to induction Oxygen Delivery Method: Circle system utilized Preoxygenation: Pre-oxygenation with 100% oxygen Induction Type: IV induction Ventilation: Mask ventilation without difficulty Laryngoscope Size: Miller and 2 Grade View: Grade I Tube type: Oral Tube size: 7.0 mm Number of attempts: 1 Airway Equipment and Method: Stylet Placement Confirmation: ETT inserted through vocal cords under direct vision, positive ETCO2 and breath sounds checked- equal and bilateral Secured at: 21 cm Tube secured with: Tape Dental Injury: Teeth and Oropharynx as per pre-operative assessment

## 2022-10-16 NOTE — Anesthesia Procedure Notes (Signed)
Anesthesia Regional Block: TAP block   Pre-Anesthetic Checklist: , timeout performed,  Correct Patient, Correct Site, Correct Laterality,  Correct Procedure, Correct Position, site marked,  Risks and benefits discussed,  Surgical consent,  Pre-op evaluation,  At surgeon's request and post-op pain management  Laterality: Left and Right  Prep: chloraprep       Needles:  Injection technique: Single-shot  Needle Type: Echogenic Stimulator Needle     Needle Length: 9cm  Needle Gauge: 21     Additional Needles:   Procedures:,,,, ultrasound used (permanent image in chart),,    Narrative:  Start time: 10/16/2022 6:59 AM End time: 10/16/2022 7:06 AM Injection made incrementally with aspirations every 5 mL.  Performed by: Personally  Anesthesiologist: Linton Rump, MD  Additional Notes: Discussed risks and benefits of nerve block including, but not limited to, prolonged and/or permanent nerve injury involving sensory and/or motor function. Monitors were applied and a time-out was performed. The nerve and associated structures were visualized under ultrasound guidance. After negative aspiration, local anesthetic was slowly injected around the nerve. There was no evidence of high pressure during the procedure. There were no paresthesias. VSS remained stable and the patient tolerated the procedure well.

## 2022-10-16 NOTE — Addendum Note (Signed)
Addendum  created 10/16/22 1154 by Caren Macadam, CRNA   Intraprocedure Meds edited

## 2022-10-16 NOTE — Anesthesia Postprocedure Evaluation (Signed)
Anesthesia Post Note  Patient: Kathleen Bender  Procedure(s) Performed: HYSTERECTOMY ABDOMINAL WITH SALPINGECTOMY (Bilateral: Abdomen)     Patient location during evaluation: PACU Anesthesia Type: General Level of consciousness: awake Pain management: pain level controlled Vital Signs Assessment: post-procedure vital signs reviewed and stable Respiratory status: spontaneous breathing, nonlabored ventilation and respiratory function stable Cardiovascular status: blood pressure returned to baseline and stable Postop Assessment: no apparent nausea or vomiting Anesthetic complications: no   No notable events documented.  Last Vitals:  Vitals:   10/16/22 1030 10/16/22 1045  BP: 117/70 128/71  Pulse: 65   Resp: 17   Temp:    SpO2: 97%     Last Pain:  Vitals:   10/16/22 1030  PainSc: Asleep                 Linton Rump

## 2022-10-16 NOTE — Progress Notes (Signed)
Pt arrived to 6 north room 31 alert and oriented x4. Pain level 3/10. Peripad in placed. Honeycomb dressing with scant drainage noted. Bed in lowest position, call light in reach. Husband at bedside. Will continue to monitor pt.

## 2022-10-16 NOTE — Progress Notes (Signed)
Clear liquid tray ordered 

## 2022-10-17 ENCOUNTER — Encounter (HOSPITAL_COMMUNITY): Payer: Self-pay | Admitting: Obstetrics & Gynecology

## 2022-10-17 LAB — CBC
HCT: 34.9 % — ABNORMAL LOW (ref 36.0–46.0)
Hemoglobin: 11.7 g/dL — ABNORMAL LOW (ref 12.0–15.0)
MCH: 30.5 pg (ref 26.0–34.0)
MCHC: 33.5 g/dL (ref 30.0–36.0)
MCV: 90.9 fL (ref 80.0–100.0)
Platelets: 205 10*3/uL (ref 150–400)
RBC: 3.84 MIL/uL — ABNORMAL LOW (ref 3.87–5.11)
RDW: 12.5 % (ref 11.5–15.5)
WBC: 9.6 10*3/uL (ref 4.0–10.5)
nRBC: 0 % (ref 0.0–0.2)

## 2022-10-17 LAB — ABO/RH: ABO/RH(D): A POS

## 2022-10-17 LAB — SURGICAL PATHOLOGY

## 2022-10-17 MED ORDER — OXYCODONE HCL 5 MG PO TABS: 5.0000 mg | ORAL_TABLET | ORAL | 0 refills | Status: DC | PRN

## 2022-10-17 MED ORDER — IBUPROFEN 600 MG PO TABS: 600.0000 mg | ORAL_TABLET | Freq: Four times a day (QID) | ORAL | 0 refills | Status: DC

## 2022-10-17 MED ORDER — SIMETHICONE 80 MG PO CHEW: 80.0000 mg | CHEWABLE_TABLET | Freq: Four times a day (QID) | ORAL | 0 refills | Status: DC | PRN

## 2022-10-17 NOTE — Discharge Summary (Signed)
Physician Discharge Summary  Patient ID: Kathleen Bender MRN: [id number] DOB/AGE: 48-09-1975 48 y.o.  Admit date: 10/16/2022 Discharge date: 10/17/2022  Admission Diagnoses:  Discharge Diagnoses:  Principal Problem:   Fibroids Active Problems:   S/P TAH (total abdominal hysterectomy)   Discharged Condition: {condition:18240}  Hospital Course: ***  Consults: {consultation:18241}  Significant Diagnostic Studies: {diagnostics:18242}  Treatments: {Tx:18249}  Discharge Exam: Blood pressure 127/66, pulse (!) 59, temperature 98.5 F (36.9 C), temperature source Oral, resp. rate 18, height 5\' 7"  (1.702 m), weight 68.5 kg, last menstrual period 08/19/2022, SpO2 99 %. {physical WUJW:1191478}  Disposition: Discharge disposition: 01-Home or Self Care       Discharge Instructions     Call MD for:   Complete by: As directed    Vaginal bleeding other than just a few spots   Call MD for:  difficulty breathing, headache or visual disturbances   Complete by: As directed    Call MD for:  extreme fatigue   Complete by: As directed    Call MD for:  hives   Complete by: As directed    Call MD for:  persistant dizziness or light-headedness   Complete by: As directed    Call MD for:  persistant nausea and vomiting   Complete by: As directed    Call MD for:  redness, tenderness, or signs of infection (pain, swelling, redness, odor or green/yellow discharge around incision site)   Complete by: As directed    Call MD for:  severe uncontrolled pain   Complete by: As directed    Call MD for:  temperature >100.4   Complete by: As directed    Diet - low sodium heart healthy   Complete by: As directed    Discharge wound care:   Complete by: As directed    Remove dressing on 10/21/2022   Driving Restrictions   Complete by: As directed    2-3 weeks and more as needed   Increase activity slowly   Complete by: As directed    Lifting restrictions   Complete by: As directed    10 pounds  only for 6 weeks   Sexual Activity Restrictions   Complete by: As directed    6 weeks      Allergies as of 10/17/2022   No Known Allergies      Medication List     TAKE these medications    acetaminophen 500 MG tablet Commonly known as: TYLENOL Take 1,000 mg by mouth every 6 (six) hours as needed (pain.).   ibuprofen 600 MG tablet Commonly known as: ADVIL Take 1 tablet (600 mg total) by mouth every 6 (six) hours. What changed:  medication strength when to take this reasons to take this   multivitamin with minerals Tabs tablet Take 1 tablet by mouth in the morning.   oxyCODONE 5 MG immediate release tablet Commonly known as: Oxy IR/ROXICODONE Take 1 tablet (5 mg total) by mouth every 4 (four) hours as needed for moderate pain.   PATADAY OP Place 1-2 drops into both eyes at bedtime as needed (allergy/irritated eyes).   simethicone 80 MG chewable tablet Commonly known as: MYLICON Chew 1 tablet (80 mg total) by mouth 4 (four) times daily as needed for flatulence.               Discharge Care Instructions  (From admission, onward)           Start     Ordered   10/17/22 0000  Discharge wound  care:       Comments: Remove dressing on 10/21/2022   10/17/22 2841            Follow-up Information     Shea Evans, MD Follow up in 2 week(s).   Specialty: Obstetrics and Gynecology Contact information: [address] [place] [address] [phone]                 Signed: Robley Fries 10/17/2022, 8:07 AM

## 2022-10-17 NOTE — Plan of Care (Signed)
  Problem: Education: Goal: Knowledge of the prescribed therapeutic regimen will improve Outcome: Progressing Goal: Understanding of sexual limitations or changes related to disease process or condition will improve Outcome: Progressing Goal: Individualized Educational Video(s) Outcome: Progressing   Problem: Self-Concept: Goal: Communication of feelings regarding changes in body function or appearance will improve Outcome: Progressing   Problem: Skin Integrity: Goal: Demonstration of wound healing without infection will improve Outcome: Progressing   

## 2022-10-17 NOTE — Progress Notes (Signed)
AVS given and reviewed with pt. Medications discussed. All questions answered to satisfaction. Pt verbalized understanding of information given. Pt escorted off the unit with all belongings via wheelchair by volunteer services.  

## 2022-10-17 NOTE — Progress Notes (Signed)
1 Day Post-Op Procedure(s) (LRB): HYSTERECTOMY ABDOMINAL WITH SALPINGECTOMY (Bilateral)  Subjective: Patient reports gas pain. NO n/v. Ambulating voiding. Not started solid diet yet. .    Objective: I have reviewed patient's vital signs, intake and output, medications, and labs.  General: alert and cooperative Resp: clear to auscultation bilaterally Cardio: regular rate and rhythm, S1, S2 normal, no murmur, click, rub or gallop GI: soft, non-tender; bowel sounds normal; no masses,  no organomegaly Extremities: extremities normal, atraumatic, no cyanosis or edema and Homans sign is negative, no sign of DVT Vaginal Bleeding: none  Assessment: s/p Procedure(s): HYSTERECTOMY ABDOMINAL WITH SALPINGECTOMY (Bilateral): stable, progressing well, and ready to eat. Pain control is good  Plan: Advance diet Encourage ambulation Advance to PO medication Discontinue IV fluids Discharge home  LOS: 1 day   Post op care and warning signs dw pt  Rto 2 wks for post op check  Robley Fries, MD 10/17/2022, 6:39 AM

## 2022-10-29 DIAGNOSIS — H40023 Open angle with borderline findings, high risk, bilateral: Secondary | ICD-10-CM | POA: Diagnosis not present

## 2023-05-28 ENCOUNTER — Ambulatory Visit
Admission: EM | Admit: 2023-05-28 | Discharge: 2023-05-28 | Disposition: A | Discharge status: Home / Self Care | Payer: BC Managed Care – PPO | Attending: Internal Medicine | Admitting: Internal Medicine

## 2023-05-28 DIAGNOSIS — J209 Acute bronchitis, unspecified: Secondary | ICD-10-CM

## 2023-05-28 DIAGNOSIS — R0602 Shortness of breath: Secondary | ICD-10-CM

## 2023-05-28 MED ORDER — PROMETHAZINE-DM 6.25-15 MG/5ML PO SYRP: 5.0000 mL | ORAL_SOLUTION | Freq: Every evening | ORAL | 0 refills | Status: DC | PRN

## 2023-05-28 MED ORDER — ALBUTEROL SULFATE (2.5 MG/3ML) 0.083% IN NEBU
2.5000 mg | INHALATION_SOLUTION | Freq: Once | RESPIRATORY_TRACT | Status: AC
  Administered 2023-05-28: 2.5 mg via RESPIRATORY_TRACT

## 2023-05-28 MED ORDER — ALBUTEROL SULFATE HFA 108 (90 BASE) MCG/ACT IN AERS: 1.0000 | INHALATION_SPRAY | Freq: Four times a day (QID) | RESPIRATORY_TRACT | 0 refills | Status: DC | PRN

## 2023-05-28 MED ORDER — PREDNISONE 20 MG PO TABS: 40.0000 mg | ORAL_TABLET | Freq: Every day | ORAL | 0 refills | Status: AC

## 2023-05-28 NOTE — ED Provider Notes (Signed)
 [facility]    CSN: [id number] Arrival date & time: 05/28/23  1311      History   Chief Complaint Chief Complaint  Patient presents with   Cough   Nasal Congestion    HPI Kathleen Bender is a 48 y.o. adult.   Kathleen Bender is a 48 y.o. adult presenting for chief complaint of Cough and Nasal Congestion that started 10 days ago. Cough is sometimes productive. Nasal congestion is clear. Reports intermittent shortness of breath and bilateral chest tightness associated with cough.  Denies nausea, vomiting, diarrhea, abdominal pain, fevers, chills, rash, body aches, headache, and dizziness.  Never smoker, denies drug use.  Denies history of chronic respiratory problems.  Taking over-the-counter Mucinex and tylenol  with some relief. Husband is sick with similar illness.    Cough   Past Medical History:  Diagnosis Date   Anemia    Iron Deficiency r/t fibroids    Patient Active Problem List   Diagnosis Date Noted   Fibroids 10/16/2022   S/P TAH (total abdominal hysterectomy) 10/16/2022    Past Surgical History:  Procedure Laterality Date   CESAREAN SECTION  2009   HYSTERECTOMY ABDOMINAL WITH SALPINGECTOMY Bilateral 10/16/2022   Procedure: HYSTERECTOMY ABDOMINAL WITH SALPINGECTOMY;  Surgeon: Barbette Knock, MD;  Location: [facility] OR;  Service: Gynecology;  Laterality: Bilateral;   KNEE SURGERY     49 years old   TUBAL LIGATION  2009    OB History   No obstetric history on file.      Home Medications    Prior to Admission medications   Medication Sig Start Date End Date Taking? Authorizing Provider  albuterol  (VENTOLIN  HFA) 108 (90 Base) MCG/ACT inhaler Inhale 1-2 puffs into the lungs every 6 (six) hours as needed for wheezing or shortness of breath. 05/28/23  Yes Enedelia Dorna HERO, FNP  predniSONE  (DELTASONE ) 20 MG tablet Take 2 tablets (40 mg total) by mouth daily with breakfast for 5 days. 05/28/23 06/02/23 Yes StanhopeDorna HERO, FNP   promethazine -dextromethorphan (PROMETHAZINE -DM) 6.25-15 MG/5ML syrup Take 5 mLs by mouth at bedtime as needed for cough. 05/28/23  Yes Enedelia Dorna HERO, FNP  acetaminophen  (TYLENOL ) 500 MG tablet Take 1,000 mg by mouth every 6 (six) hours as needed (pain.).    [provider]  ibuprofen  (ADVIL ) 600 MG tablet Take 1 tablet (600 mg total) by mouth every 6 (six) hours. 10/17/22   Barbette Knock, MD  Multiple Vitamin (MULTIVITAMIN WITH MINERALS) TABS tablet Take 1 tablet by mouth in the morning.    [provider]  Olopatadine HCl (PATADAY OP) Place 1-2 drops into both eyes at bedtime as needed (allergy/irritated eyes).    [provider]  oxyCODONE  (OXY IR/ROXICODONE ) 5 MG immediate release tablet Take 1 tablet (5 mg total) by mouth every 4 (four) hours as needed for moderate pain. 10/17/22   Barbette Knock, MD  simethicone  (MYLICON) 80 MG chewable tablet Chew 1 tablet (80 mg total) by mouth 4 (four) times daily as needed for flatulence. 10/17/22   Barbette Knock, MD    Family History History reviewed. No pertinent family history.  Social History Social History   Tobacco Use   Smoking status: Never   Smokeless tobacco: Never  Vaping Use   Vaping status: Never Used  Substance Use Topics   Alcohol use: Never   Drug use: Never     Allergies   Patient has no known allergies.   Review of Systems Review of Systems  Respiratory:  Positive  for cough.   Per HPI   Physical Exam Triage Vital Signs ED Triage Vitals  Encounter Vitals Group     BP 05/28/23 1326 132/83     Systolic BP Percentile --      Diastolic BP Percentile --      Pulse Rate 05/28/23 1326 77     Resp 05/28/23 1326 18     Temp 05/28/23 1326 98.2 F (36.8 C)     Temp Source 05/28/23 1326 Oral     SpO2 05/28/23 1326 97 %     Weight 05/28/23 1324 153 lb (69.4 kg)     Height 05/28/23 1324 5' 7 (1.702 m)     Head Circumference --      Peak Flow --      Pain Score 05/28/23 1324 0      Pain Loc --      Pain Education --      Exclude from Growth Chart --    No data found.  Updated Vital Signs BP 132/83 (BP Location: Right Arm)   Pulse 77   Temp 98.2 F (36.8 C) (Oral)   Resp 18   Ht 5' 7 (1.702 m)   Wt 153 lb (69.4 kg)   LMP 08/19/2022 (Approximate)   SpO2 97%   BMI 23.96 kg/m   Visual Acuity Right Eye Distance:   Left Eye Distance:   Bilateral Distance:    Right Eye Near:   Left Eye Near:    Bilateral Near:     Physical Exam Vitals and nursing note reviewed.  Constitutional:      Appearance: She is not ill-appearing or toxic-appearing.  HENT:     Head: Normocephalic and atraumatic.     Right Ear: Hearing, tympanic membrane, ear canal and external ear normal.     Left Ear: Hearing, tympanic membrane, ear canal and external ear normal.     Nose: Congestion present.     Mouth/Throat:     Lips: Pink.     Mouth: Mucous membranes are moist. No injury or oral lesions.     Dentition: Normal dentition.     Tongue: No lesions.     Pharynx: Oropharynx is clear. Uvula midline. Posterior oropharyngeal erythema present. No pharyngeal swelling, oropharyngeal exudate, uvula swelling or postnasal drip.     Tonsils: No tonsillar exudate.  Eyes:     General: Lids are normal. Vision grossly intact. Gaze aligned appropriately.     Extraocular Movements: Extraocular movements intact.     Conjunctiva/sclera: Conjunctivae normal.  Neck:     Trachea: Trachea and phonation normal.  Cardiovascular:     Rate and Rhythm: Normal rate and regular rhythm.     Heart sounds: Normal heart sounds, S1 normal and S2 normal.  Pulmonary:     Effort: Pulmonary effort is normal. No respiratory distress.     Breath sounds: Normal air entry. Wheezing (expiratory wheeze heard with cough. Frequent harsh cough with both rest and deep inspiration on exam.) present. No rhonchi or rales.  Chest:     Chest wall: No tenderness.  Musculoskeletal:     Cervical back: Neck supple.   Lymphadenopathy:     Cervical: No cervical adenopathy.  Skin:    General: Skin is warm and dry.     Capillary Refill: Capillary refill takes less than 2 seconds.     Findings: No rash.  Neurological:     General: No focal deficit present.     Mental Status: She is alert and oriented  to person, place, and time. Mental status is at baseline.     Cranial Nerves: No dysarthria or facial asymmetry.  Psychiatric:        Mood and Affect: Mood normal.        Speech: Speech normal.        Behavior: Behavior normal.        Thought Content: Thought content normal.        Judgment: Judgment normal.      UC Treatments / Results  Labs (all labs ordered are listed, but only abnormal results are displayed) Labs Reviewed - No data to display  EKG   Radiology No results found.  Procedures Procedures (including critical care time)  Medications Ordered in UC Medications  albuterol  (PROVENTIL ) (2.5 MG/3ML) 0.083% nebulizer solution 2.5 mg (2.5 mg Nebulization Given 05/28/23 1353)    Initial Impression / Assessment and Plan / UC Course  I have reviewed the triage vital signs and the nursing notes.  Pertinent labs & imaging results that were available during my care of the patient were reviewed by me and considered in my medical decision making (see chart for details).   1. Acute bronchitis, shortness of breath Presentation consistent with acute viral bronchitis. Will treat with steroid, bronchodilator, cough suppressants for symptomatic relief, and expectorants (mucinex) as needed- see AVS.   Interventions in clinic: Albuterol  breathing treatment administered with improvement in lung sounds on reassessment  Patient non-toxic in appearance, vital signs hemodynamically stable, no new oxygen requirement, therefore deferred imaging of the chest. Low suspicion for acute cardiopulmonary abnormality based on history and PE.  Strep/Viral testing: deferred based on timing of illness  Counseled  patient on potential for adverse effects with medications prescribed/recommended today, strict ER and return-to-clinic precautions discussed, patient verbalized understanding.    Final Clinical Impressions(s) / UC Diagnoses   Final diagnoses:  Acute bronchitis, unspecified organism  Shortness of breath     Discharge Instructions      You have bronchitis which is inflammation of the upper airways in your lungs due to a virus. The following medicines will help with your symptoms.   - Take steroid pills sent to pharmacy as directed. Do not take any other NSAID containing medications such as ibuprofen  or naproxen/Aleve while taking prednisone . - You may use albuterol  inhaler 1 to 2 puffs every 4-6 hours as needed for cough, shortness of breath, and wheezing. - Take cough medicines as prescribed.  - Continue using over the counter medicines as needed as directed. Plain mucinex (guaifenesin) over the counter may further help breakup mucus and help with symptoms.   If you develop any new or worsening symptoms or do not improve in the next 2 to 3 days, please return.  If your symptoms are severe, please go to the emergency room. Follow-up with PCP as needed.      ED Prescriptions     Medication Sig Dispense Auth. Provider   predniSONE  (DELTASONE ) 20 MG tablet Take 2 tablets (40 mg total) by mouth daily with breakfast for 5 days. 10 tablet Enedelia Dorna HERO, FNP   albuterol  (VENTOLIN  HFA) 108 (90 Base) MCG/ACT inhaler Inhale 1-2 puffs into the lungs every 6 (six) hours as needed for wheezing or shortness of breath. 8 g Enedelia Dorna M, FNP   promethazine -dextromethorphan (PROMETHAZINE -DM) 6.25-15 MG/5ML syrup Take 5 mLs by mouth at bedtime as needed for cough. 118 mL Enedelia Dorna HERO, FNP      PDMP not reviewed this encounter.   Enedelia Dorna  M, FNP 05/28/23 1359

## 2023-05-28 NOTE — ED Triage Notes (Signed)
 Pt presents with complaints of nasal congestion and cough for approximately week and a half. Pt denies fevers at home. Pt currently denies pain. Mucinex DM and Tylenol  are the only medications taken with improvement. Sore throat reported early this morning however this has now resolved.

## 2023-05-28 NOTE — Discharge Instructions (Addendum)
You have bronchitis which is inflammation of the upper airways in your lungs due to a virus. The following medicines will help with your symptoms.   - Take steroid pills sent to pharmacy as directed. Do not take any other NSAID containing medications such as ibuprofen or naproxen/Aleve while taking prednisone. - You may use albuterol inhaler 1 to 2 puffs every 4-6 hours as needed for cough, shortness of breath, and wheezing. - Take cough medicines as prescribed.  - Continue using over the counter medicines as needed as directed. Plain mucinex (guaifenesin) over the counter may further help breakup mucus and help with symptoms.   If you develop any new or worsening symptoms or do not improve in the next 2 to 3 days, please return.  If your symptoms are severe, please go to the emergency room. Follow-up with PCP as needed.

## 2023-08-13 DIAGNOSIS — H40023 Open angle with borderline findings, high risk, bilateral: Secondary | ICD-10-CM | POA: Diagnosis not present

## 2023-08-17 DIAGNOSIS — E559 Vitamin D deficiency, unspecified: Secondary | ICD-10-CM | POA: Diagnosis not present

## 2023-08-17 DIAGNOSIS — E611 Iron deficiency: Secondary | ICD-10-CM | POA: Diagnosis not present

## 2023-08-24 DIAGNOSIS — Z23 Encounter for immunization: Secondary | ICD-10-CM | POA: Diagnosis not present

## 2023-08-24 DIAGNOSIS — Z0000 Encounter for general adult medical examination without abnormal findings: Secondary | ICD-10-CM | POA: Diagnosis not present

## 2023-08-24 DIAGNOSIS — I868 Varicose veins of other specified sites: Secondary | ICD-10-CM | POA: Diagnosis not present

## 2023-09-15 ENCOUNTER — Encounter: Payer: Self-pay | Admitting: Gastroenterology

## 2023-11-05 ENCOUNTER — Ambulatory Visit: Admitting: Gastroenterology

## 2023-11-05 ENCOUNTER — Encounter: Payer: Self-pay | Admitting: Gastroenterology

## 2023-11-05 VITALS — BP 110/68 | HR 80 | Ht 65.0 in | Wt 155.5 lb

## 2023-11-05 DIAGNOSIS — D224 Melanocytic nevi of scalp and neck: Secondary | ICD-10-CM | POA: Diagnosis not present

## 2023-11-05 DIAGNOSIS — K219 Gastro-esophageal reflux disease without esophagitis: Secondary | ICD-10-CM | POA: Diagnosis not present

## 2023-11-05 DIAGNOSIS — R1310 Dysphagia, unspecified: Secondary | ICD-10-CM

## 2023-11-05 DIAGNOSIS — D2262 Melanocytic nevi of left upper limb, including shoulder: Secondary | ICD-10-CM | POA: Diagnosis not present

## 2023-11-05 DIAGNOSIS — R1319 Other dysphagia: Secondary | ICD-10-CM

## 2023-11-05 DIAGNOSIS — D2239 Melanocytic nevi of other parts of face: Secondary | ICD-10-CM | POA: Diagnosis not present

## 2023-11-05 DIAGNOSIS — L814 Other melanin hyperpigmentation: Secondary | ICD-10-CM | POA: Diagnosis not present

## 2023-11-05 DIAGNOSIS — L570 Actinic keratosis: Secondary | ICD-10-CM | POA: Diagnosis not present

## 2023-11-05 DIAGNOSIS — D485 Neoplasm of uncertain behavior of skin: Secondary | ICD-10-CM | POA: Diagnosis not present

## 2023-11-05 DIAGNOSIS — D225 Melanocytic nevi of trunk: Secondary | ICD-10-CM | POA: Diagnosis not present

## 2023-11-05 NOTE — Patient Instructions (Signed)
 You have been scheduled for an endoscopy. Please follow written instructions given to you at your visit today.  If you use inhalers (even only as needed), please bring them with you on the day of your procedure.  If you take any of the following medications, they will need to be adjusted prior to your procedure:   DO NOT TAKE 7 DAYS PRIOR TO TEST- Trulicity (dulaglutide) Ozempic, Wegovy (semaglutide) Mounjaro (tirzepatide) Bydureon Bcise (exanatide extended release)  DO NOT TAKE 1 DAY PRIOR TO YOUR TEST Rybelsus (semaglutide) Adlyxin (lixisenatide) Victoza (liraglutide) Byetta (exanatide) ___________________________________________________________________________  _______________________________________________________  If your blood pressure at your visit was 140/90 or greater, please contact your primary care physician to follow up on this.  _______________________________________________________  If you are age 80 or older, your body mass index should be between 23-30. Your Body mass index is 25.88 kg/m. If this is out of the aforementioned range listed, please consider follow up with your Primary Care Provider.  If you are age 77 or younger, your body mass index should be between 19-25. Your Body mass index is 25.88 kg/m. If this is out of the aformentioned range listed, please consider follow up with your Primary Care Provider.   ________________________________________________________  The [facility] providers would like to encourage you to use MYCHART to communicate with providers for non-urgent requests or questions.  Due to long hold times on the telephone, sending your provider a message by [facility] may be a faster and more efficient way to get a response.  Please allow 48 business hours for a response.  Please remember that this is for non-urgent requests.  _______________________________________________________  I appreciate the opportunity to care for you. Suzanna Erp, PA

## 2023-11-05 NOTE — Progress Notes (Signed)
 Chief Complaint: dysphagia Primary GI MD: Para Bold  HPI: Discussed the use of AI scribe software for clinical note transcription with the patient, who gave verbal consent to proceed.  History of Present Illness Kathleen Bender is a 48 year old female who presents with trouble swallowing and indigestion.  She has experienced dysphagia for a couple of years, occurring a few times a week. The condition is exacerbated by dry meats and cold foods like [place] fries and tater tots. She manages by adding sauce to meats and avoiding large bites. Certain pills, particularly 'chalky' white ones, tend to get stuck.  She has a history of indigestion and reflux, which was severe before her hysterectomy last year due to uterine fibroids. Post-surgery, her symptoms have improved significantly. She occasionally uses ibuprofen  but not daily, and has tried Prilosec in the past with uncertain benefit. She has reduced soda intake, which she feels has helped. She sometimes uses baking soda or apple cider vinegar to manage symptoms and experiences vomiting due to indigestion two to three times a year, which provides relief.  She has not undergone an endoscopy or colonoscopy but has a Cologuard test at home for colon cancer screening. There is no family history of colon cancer.   Past Medical History:  Diagnosis Date   Anemia    Iron Deficiency r/t fibroids   Glaucoma     Past Surgical History:  Procedure Laterality Date   CESAREAN SECTION  2009   with tubal ligation   HYSTERECTOMY ABDOMINAL WITH SALPINGECTOMY Bilateral 10/16/2022   Procedure: HYSTERECTOMY ABDOMINAL WITH SALPINGECTOMY;  Surgeon: Terri Fester, MD;  Location: [facility] OR;  Service: Gynecology;  Laterality: Bilateral;   KNEE SURGERY Right    48 years old   TUBAL LIGATION  2009    Current Outpatient Medications  Medication Sig Dispense Refill   acetaminophen  (TYLENOL ) 500 MG tablet Take 1,000 mg by mouth every 6 (six) hours as needed  (pain.). (Patient taking differently: Take 1,000 mg by mouth as needed (pain.).)     Cholecalciferol (VITAMIN D-3 PO) Take 1 capsule by mouth daily.     latanoprost (XALATAN) 0.005 % ophthalmic solution Place 1 drop into both eyes at bedtime.     Multiple Vitamin (MULTIVITAMIN WITH MINERALS) TABS tablet Take 1 tablet by mouth in the morning.     Multiple Vitamins-Minerals (ZINC PO) Take 1 tablet by mouth daily.     Olopatadine HCl (PATADAY OP) Place 1-2 drops into both eyes at bedtime as needed (allergy/irritated eyes).     No current facility-administered medications for this visit.    Allergies as of 11/05/2023 - Review Complete 11/05/2023  Allergen Reaction Noted   Nsaids Nausea And Vomiting 03/31/2017    Family History  Problem Relation Age of Onset   Hyperlipidemia Mother    Depression Mother    Stroke Father    Arthritis Father    Depression Sister    ADD / ADHD Sister    Alzheimer's disease Maternal Grandmother    Heart attack Maternal Grandfather    Stroke Paternal Grandmother     Social History   Socioeconomic History   Marital status: Married    Spouse name: Not on file   Number of children: 3   Years of education: Not on file   Highest education level: Not on file  Occupational History   Not on file  Tobacco Use   Smoking status: Never   Smokeless tobacco: Never  Vaping Use   Vaping status: Never Used  Substance and Sexual Activity   Alcohol use: Yes    Comment: occasinal   Drug use: Never   Sexual activity: Yes  Other Topics Concern   Not on file  Social History Narrative   Not on file   Social Drivers of Health   Financial Resource Strain: Not on file  Food Insecurity: Not on file  Transportation Needs: Not on file  Physical Activity: Not on file  Stress: Not on file  Social Connections: Not on file  Intimate Partner Violence: Not on file    Review of Systems:    Constitutional: No weight loss, fever, chills, weakness or fatigue HEENT:  Eyes: No change in vision               Ears, Nose, Throat:  No change in hearing or congestion Skin: No rash or itching Cardiovascular: No chest pain, chest pressure or palpitations   Respiratory: No SOB or cough Gastrointestinal: See HPI and otherwise negative Genitourinary: No dysuria or change in urinary frequency Neurological: No headache, dizziness or syncope Musculoskeletal: No new muscle or joint pain Hematologic: No bleeding or bruising Psychiatric: No history of depression or anxiety    Physical Exam:  Vital signs: BP 110/68 (BP Location: Left Arm, Patient Position: Sitting, Cuff Size: Normal)   Pulse 80   Ht 5' 5 (1.651 m) Comment: height measured without shoes  Wt 155 lb 8 oz (70.5 kg)   LMP 08/19/2022 (Approximate)   BMI 25.88 kg/m   Constitutional: NAD, alert and cooperative Head:  Normocephalic and atraumatic. Eyes:   PEERL, EOMI. No icterus. Conjunctiva pink. Respiratory: Respirations even and unlabored. Lungs clear to auscultation bilaterally.   No wheezes, crackles, or rhonchi.  Cardiovascular:  Regular rate and rhythm. No peripheral edema, cyanosis or pallor.  Gastrointestinal:  Soft, nondistended, nontender. No rebound or guarding. Normal bowel sounds. No appreciable masses or hepatomegaly. Rectal:  Declines Msk:  Symmetrical without gross deformities. Without edema, no deformity or joint abnormality.  Neurologic:  Alert and  oriented x4;  grossly normal neurologically.  Skin:   Dry and intact without significant lesions or rashes. Psychiatric: Oriented to person, place and time. Demonstrates good judgement and reason without abnormal affect or behaviors.  RELEVANT LABS AND IMAGING: CBC    Component Value Date/Time   WBC 9.6 10/17/2022 0323   RBC 3.84 (L) 10/17/2022 0323   HGB 11.7 (L) 10/17/2022 0323   HCT 34.9 (L) 10/17/2022 0323   PLT 205 10/17/2022 0323   MCV 90.9 10/17/2022 0323   MCH 30.5 10/17/2022 0323   MCHC 33.5 10/17/2022 0323   RDW 12.5  10/17/2022 0323    CMP     Component Value Date/Time   NA 137 10/08/2022 1130   K 4.2 10/08/2022 1130   CL 104 10/08/2022 1130   CO2 25 10/08/2022 1130   GLUCOSE 101 (H) 10/08/2022 1130   BUN 15 10/08/2022 1130   CREATININE 0.43 (L) 10/08/2022 1130   CALCIUM 9.9 10/08/2022 1130   GFRNONAA >60 10/08/2022 1130     Assessment/Plan:    Assessment & Plan  Dysphagia Intermittent dysphagia with dry meats and cold starchy foods ongoing for years occurring multiple times per week. Possible esophageal dysmotility, stricture, or GERD-related inflammation. No prior endoscopy. Family history of esophageal dilation procedures. Endoscopy discussed for evaluation and potential intervention. - Schedule endoscopy to evaluate for esophageal stricture, inflammation, or other abnormalities. - Consider therapeutic esophageal dilation - Discussed risks of endoscopy including anesthesia-related risks and benefits of direct visualization  and potential therapeutic intervention.  GERD GERD improved post-hysterectomy with occasional indigestion and vomiting. Previous Prilosec use with uncertain benefit. Uses baking soda and apple cider vinegar for relief. Discussed omeprazole or Pepcid for management. Lifestyle modifications beneficial. - Recommend omeprazole 20 mg over the counter for daily use if desired. - Alternatively, use Pepcid (famotidine) as needed for symptom relief. - Discussed lifestyle modifications including reducing soda intake and increasing water consumption.  Colon cancer screening Has cologuard at home she plans to complete.  No family history of colon cancer.  No lower GI symptoms.  Extensive discussion discussing risk first benefits of colonoscopy versus Cologuard as well as 93% accuracy right including false positives false negatives and if Cologuard comes back positive recommendation of colonoscopy  Assigned to Dr. Bridgett Camps based on scheduling availability as he has an opening  tomorrow  Kathleen Bender [facility] 11/05/2023, 12:06 PM  Cc: Aldo Hun, MD

## 2023-11-06 ENCOUNTER — Ambulatory Visit: Admitting: Internal Medicine

## 2023-11-06 ENCOUNTER — Encounter: Payer: Self-pay | Admitting: Internal Medicine

## 2023-11-06 VITALS — BP 128/75 | HR 68 | Temp 98.1°F | Resp 14 | Ht 65.0 in | Wt 155.0 lb

## 2023-11-06 DIAGNOSIS — R1310 Dysphagia, unspecified: Secondary | ICD-10-CM | POA: Diagnosis not present

## 2023-11-06 DIAGNOSIS — K2289 Other specified disease of esophagus: Secondary | ICD-10-CM | POA: Diagnosis not present

## 2023-11-06 DIAGNOSIS — K2100 Gastro-esophageal reflux disease with esophagitis, without bleeding: Secondary | ICD-10-CM

## 2023-11-06 DIAGNOSIS — T182XXA Foreign body in stomach, initial encounter: Secondary | ICD-10-CM | POA: Diagnosis not present

## 2023-11-06 DIAGNOSIS — R1319 Other dysphagia: Secondary | ICD-10-CM

## 2023-11-06 DIAGNOSIS — K229 Disease of esophagus, unspecified: Secondary | ICD-10-CM | POA: Diagnosis not present

## 2023-11-06 DIAGNOSIS — K222 Esophageal obstruction: Secondary | ICD-10-CM

## 2023-11-06 DIAGNOSIS — K209 Esophagitis, unspecified without bleeding: Secondary | ICD-10-CM

## 2023-11-06 DIAGNOSIS — K311 Adult hypertrophic pyloric stenosis: Secondary | ICD-10-CM

## 2023-11-06 DIAGNOSIS — K449 Diaphragmatic hernia without obstruction or gangrene: Secondary | ICD-10-CM | POA: Diagnosis not present

## 2023-11-06 MED ORDER — SODIUM CHLORIDE 0.9 % IV SOLN: 500.0000 mL | Freq: Once | INTRAVENOUS | Status: DC

## 2023-11-06 MED ORDER — PANTOPRAZOLE SODIUM 40 MG PO TBEC
40.0000 mg | DELAYED_RELEASE_TABLET | Freq: Two times a day (BID) | ORAL | 3 refills | Status: AC
Start: 2023-11-06 — End: ?

## 2023-11-06 NOTE — Progress Notes (Signed)
 A/o x 3, gd SR's, report to RN

## 2023-11-06 NOTE — Op Note (Signed)
 [facility] Patient Name: Kathleen Bender Procedure Date: 11/06/2023 8:09 AM MRN: [id number] Endoscopist: Nannette Babe , MD, [id number] Age: 48 Referring MD:  Date of Birth: [birth year] Gender: Female Account #: [id number] Procedure:                Upper GI endoscopy Indications:              Dysphagia, Heartburn Medicines:                Monitored Anesthesia Care Procedure:                Pre-Anesthesia Assessment:                           - Prior to the procedure, a History and Physical                            was performed, and patient medications and                            allergies were reviewed. The patient's tolerance of                            previous anesthesia was also reviewed. The risks                            and benefits of the procedure and the sedation                            options and risks were discussed with the patient.                            All questions were answered, and informed consent                            was obtained. Prior Anticoagulants: The patient has                            taken no anticoagulant or antiplatelet agents. ASA                            Grade Assessment: I - A normal, healthy patient.                            After reviewing the risks and benefits, the patient                            was deemed in satisfactory condition to undergo the                            procedure.                           After obtaining informed consent, the endoscope was  passed under direct vision. Throughout the                            procedure, the patient's blood pressure, pulse, and                            oxygen saturations were monitored continuously. The                            Endoscope was introduced through the mouth, and                            advanced to the pylorus. The upper GI endoscopy was                            accomplished without difficulty. The patient                             tolerated the procedure well. Scope In: Scope Out: Findings:                 Salmon-colored mucosa was present in the proximal                            esophagus. This is nearly circumferential and 2-3                            cm in length. There was a stricture associated with                            the inlet patch around 21 cm from incisors. With                            endoscope passage a mucosal rent was seen in the                            proximal stricture consistent with dilation effect.                            Biopsies were taken with a cold forceps for                            histology.                           Mildly severe esophagitis characterized by linear                            furrowing and subtle rings with no bleeding was                            found in the mid esophagus. Biopsies were taken  with a cold forceps for histology.                           A non-obstructing Schatzki ring was found at the                            gastroesophageal junction.                           A 2 cm hiatal hernia was present.                           The gastroesophageal flap valve was visualized                            endoscopically and classified as Hill Grade III                            (minimal fold, loose to endoscope, hiatal hernia                            likely).                           A medium amount of food (residue) was found in the                            gastric body.                           A benign-appearing, intrinsic moderate stenosis was                            found at the pylorus. This was non-traversed.                           An examination of the duodenum was not performed. Complications:            No immediate complications. Estimated Blood Loss:     Estimated blood loss was minimal. 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 Dilation with the endoscope.                            Biopsied.                           - Mildly severe, most likely reflux esophagitis                            with no bleeding. Biopsied; exclude EoE.                           - Non-obstructing  Schatzki ring.                           - 2 cm hiatal hernia.                           - A medium amount of food (residue) in the stomach.                            Secondary to pyloric stenosis.                           - Gastric stenosis was found at the pylorus. Not                            traversed today. Recommendation:           - Patient has a contact number available for                            emergencies. The signs and symptoms of potential                            delayed complications were discussed with the                            patient. Return to normal activities tomorrow.                            Written discharge instructions were provided to the                            patient.                           - Advance diet as tolerated.                           - Continue present medications.                           - Begin pantoprazole 40 mg twice daily before meals                            (this is best taken 30 min before your 1st and last                            meal of the day). After 8 weeks can reduce to once                            daily assuming reflux symptoms are controlled.                           - Await pathology results.                           -  Repeat upper endoscopy in the outpatient hospital                            setting at appointment to be scheduled to check                            healing and for retreatment (possible esophageal                            and pyloric channel dilation). Nannette Babe, MD 11/06/2023 8:33:50 AM This report has been signed electronically.

## 2023-11-06 NOTE — Progress Notes (Signed)
 Addendum: Reviewed and agree with assessment and management plan. Asha Grumbine, Carie Caddy, MD

## 2023-11-06 NOTE — Progress Notes (Signed)
 Pt's states no medical or surgical changes since previsit or office visit.

## 2023-11-06 NOTE — Patient Instructions (Addendum)
 Educational handout provided to patient related to Hiatal hernia  Advance diet as tolerated  Continue present medications  Awaiting pathology results  Office to schedule repeat EGD at hospital and will contact you.  YOU HAD AN ENDOSCOPIC PROCEDURE TODAY AT THE [facility]:   Refer to the procedure report that was given to you for any specific questions about what was found during the examination.  If the procedure report does not answer your questions, please call your gastroenterologist to clarify.  If you requested that your care partner not be given the details of your procedure findings, then the procedure report has been included in a sealed envelope for you to review at your convenience later.  YOU SHOULD EXPECT: Some feelings of bloating in the abdomen. Passage of more gas than usual.  Walking can help get rid of the air that was put into your GI tract during the procedure and reduce the bloating. If you had a lower endoscopy (such as a colonoscopy or flexible sigmoidoscopy) you may notice spotting of blood in your stool or on the toilet paper. If you underwent a bowel prep for your procedure, you may not have a normal bowel movement for a few days.  Please Note:  You might notice some irritation and congestion in your nose or some drainage.  This is from the oxygen used during your procedure.  There is no need for concern and it should clear up in a day or so.  SYMPTOMS TO REPORT IMMEDIATELY:  Following upper endoscopy (EGD)  Vomiting of blood or coffee ground material  New chest pain or pain under the shoulder blades  Painful or persistently difficult swallowing  New shortness of breath  Fever of 100F or higher  Black, tarry-looking stools  For urgent or emergent issues, a gastroenterologist can be reached at any hour by calling [phone]. Do not use MyChart messaging for urgent concerns.    DIET:  We do recommend a small meal at first, but then you may  proceed to your regular diet.  Drink plenty of fluids but you should avoid alcoholic beverages for 24 hours.  ACTIVITY:  You should plan to take it easy for the rest of today and you should NOT DRIVE or use heavy machinery until tomorrow (because of the sedation medicines used during the test).    FOLLOW UP: Our staff will call the number listed on your records the next business day following your procedure.  We will call around 7:15- 8:00 am to check on you and address any questions or concerns that you may have regarding the information given to you following your procedure. If we do not reach you, we will leave a message.     If any biopsies were taken you will be contacted by phone or by letter within the next 1-3 weeks.  Please call us  at [phone] if you have not heard about the biopsies in 3 weeks.    SIGNATURES/CONFIDENTIALITY: You and/or your care partner have signed paperwork which will be entered into your electronic medical record.  These signatures attest to the fact that that the information above on your After Visit Summary has been reviewed and is understood.  Full responsibility of the confidentiality of this discharge information lies with you and/or your care-partner.

## 2023-11-06 NOTE — Progress Notes (Signed)
 See office note dated yesterday for details and current H&P Patient presenting for upper endoscopy to evaluate GERD symptoms and dysphagia  She remains appropriate for upper endoscopy in the [facility] today

## 2023-11-06 NOTE — Progress Notes (Signed)
 Office RN please schedule hospital repeat EGD and notify patient.   Unable to access hospital schedule.

## 2023-11-09 ENCOUNTER — Telehealth: Payer: Self-pay | Admitting: *Deleted

## 2023-11-09 NOTE — Telephone Encounter (Signed)
  Follow up Call-     11/06/2023    7:15 AM  Call back number  Post procedure Call Back phone  # [phone]  Permission to leave phone message Yes     Patient questions:  Do you have a fever, pain , or abdominal swelling? No. Pain Score  0 *  Have you tolerated food without any problems? Yes.    Have you been able to return to your normal activities? Yes.    Do you have any questions about your discharge instructions: Diet   No. Medications  No. Follow up visit  No.  Do you have questions or concerns about your Care? No.  Actions: * If pain score is 4 or above: No action needed, pain <4.

## 2023-11-10 LAB — SURGICAL PATHOLOGY

## 2023-11-16 ENCOUNTER — Ambulatory Visit: Payer: Self-pay | Admitting: Internal Medicine

## 2023-11-25 DIAGNOSIS — H40023 Open angle with borderline findings, high risk, bilateral: Secondary | ICD-10-CM | POA: Diagnosis not present

## 2023-12-11 ENCOUNTER — Telehealth: Payer: Self-pay

## 2023-12-11 ENCOUNTER — Other Ambulatory Visit: Payer: Self-pay

## 2023-12-11 DIAGNOSIS — K2100 Gastro-esophageal reflux disease with esophagitis, without bleeding: Secondary | ICD-10-CM

## 2023-12-11 DIAGNOSIS — K311 Adult hypertrophic pyloric stenosis: Secondary | ICD-10-CM

## 2023-12-11 NOTE — Telephone Encounter (Signed)
 Instructions put on MyChart for patient to review. Informed patient that I sent instructions wrong in error and to follow the instructions that mention nothing to eat or drink after midnight. Also, informed patient to contact me if she has any questions. Patient verbalized understanding.

## 2023-12-11 NOTE — Telephone Encounter (Signed)
 Okay for a light breakfast and lunch day before but agree with clear liquid diet for dinner day before and then n.p.o. after midnight

## 2023-12-11 NOTE — Addendum Note (Signed)
 Addended by: MADAN, Margherita Collyer L on: 12/11/2023 04:44 PM   Modules accepted: Orders

## 2023-12-11 NOTE — Telephone Encounter (Signed)
 Called patient to scheduled EGD with possible pyloric channel dilation. Offered patient 02/16/24 or 03/07/24. Patient is scheduled for 02/16/24 at 7:30 am, arriving at 6:30 am at [facility]. Informed patient I will put the instructions on MyChart for her to review. Patient verbalized understanding.  Dr. SHAUNNA,  Patient had a medium amount of food left in her stomach on the last EGD. Do you want patient to be clear liquids the entire day prior to procedure or possibly have a clear liquid dinner the night before?

## 2024-02-09 ENCOUNTER — Encounter (HOSPITAL_COMMUNITY): Payer: Self-pay | Admitting: Internal Medicine

## 2024-02-10 ENCOUNTER — Telehealth: Payer: Self-pay

## 2024-02-10 NOTE — Telephone Encounter (Signed)
 Procedure:EGD Procedure date: 02/16/24 Procedure location: [facility] Arrival Time: 6:00 Spoke with the patient Y/N: Y Any prep concerns? N  Has the patient obtained the prep from the pharmacy ? N Do you have a care partner and transportation: Y Any additional concerns? N

## 2024-02-15 NOTE — Anesthesia Preprocedure Evaluation (Signed)
 Anesthesia Evaluation  Patient identified by MRN, date of birth, ID band Patient awake    Reviewed: Allergy & Precautions, NPO status , Patient's Chart, lab work & pertinent test results  Airway Mallampati: I  TM Distance: >3 FB Neck ROM: Full    Dental no notable dental hx. (+) Teeth Intact, Dental Advisory Given   Pulmonary neg pulmonary ROS   Pulmonary exam normal breath sounds clear to auscultation       Cardiovascular negative cardio ROS Normal cardiovascular exam Rhythm:Regular Rate:Normal     Neuro/Psych negative neurological ROS  negative psych ROS   GI/Hepatic Neg liver ROS,GERD  ,,  Endo/Other  negative endocrine ROS    Renal/GU negative Renal ROS  negative genitourinary   Musculoskeletal negative musculoskeletal ROS (+)    Abdominal   Peds  Hematology  (+) Blood dyscrasia, anemia   Anesthesia Other Findings   Reproductive/Obstetrics                              Anesthesia Physical Anesthesia Plan  ASA: 2  Anesthesia Plan: MAC   Post-op Pain Management: Minimal or no pain anticipated   Induction: Intravenous  PONV Risk Score and Plan: Propofol  infusion and Treatment may vary due to age or medical condition  Airway Management Planned: Natural Airway  Additional Equipment:   Intra-op Plan:   Post-operative Plan:   Informed Consent: I have reviewed the patients History and Physical, chart, labs and discussed the procedure including the risks, benefits and alternatives for the proposed anesthesia with the patient or authorized representative who has indicated his/her understanding and acceptance.     Dental advisory given  Plan Discussed with: CRNA  Anesthesia Plan Comments:          Anesthesia Quick Evaluation

## 2024-02-16 ENCOUNTER — Ambulatory Visit (HOSPITAL_COMMUNITY): Payer: Self-pay | Admitting: Anesthesiology

## 2024-02-16 ENCOUNTER — Other Ambulatory Visit: Payer: Self-pay

## 2024-02-16 ENCOUNTER — Encounter (HOSPITAL_COMMUNITY): Payer: Self-pay | Admitting: Internal Medicine

## 2024-02-16 ENCOUNTER — Ambulatory Visit (HOSPITAL_COMMUNITY)
Admission: RE | Admit: 2024-02-16 | Discharge: 2024-02-16 | Disposition: A | Discharge status: Home / Self Care | Attending: Internal Medicine | Admitting: Internal Medicine

## 2024-02-16 ENCOUNTER — Encounter (HOSPITAL_COMMUNITY): Payer: Self-pay | Admitting: Anesthesiology

## 2024-02-16 ENCOUNTER — Encounter (HOSPITAL_COMMUNITY)
Admission: RE | Disposition: A | Discharge status: Home / Self Care | Payer: Self-pay | Source: Home / Self Care | Attending: Internal Medicine

## 2024-02-16 DIAGNOSIS — K2100 Gastro-esophageal reflux disease with esophagitis, without bleeding: Secondary | ICD-10-CM

## 2024-02-16 DIAGNOSIS — K449 Diaphragmatic hernia without obstruction or gangrene: Secondary | ICD-10-CM | POA: Diagnosis not present

## 2024-02-16 DIAGNOSIS — Q398 Other congenital malformations of esophagus: Secondary | ICD-10-CM

## 2024-02-16 DIAGNOSIS — K2950 Unspecified chronic gastritis without bleeding: Secondary | ICD-10-CM | POA: Diagnosis not present

## 2024-02-16 DIAGNOSIS — K2289 Other specified disease of esophagus: Secondary | ICD-10-CM | POA: Diagnosis not present

## 2024-02-16 DIAGNOSIS — K219 Gastro-esophageal reflux disease without esophagitis: Secondary | ICD-10-CM | POA: Diagnosis not present

## 2024-02-16 DIAGNOSIS — K311 Adult hypertrophic pyloric stenosis: Secondary | ICD-10-CM | POA: Diagnosis not present

## 2024-02-16 DIAGNOSIS — Z09 Encounter for follow-up examination after completed treatment for conditions other than malignant neoplasm: Secondary | ICD-10-CM | POA: Insufficient documentation

## 2024-02-16 HISTORY — PX: BALLOON DILATION: SHX5330

## 2024-02-16 HISTORY — PX: ESOPHAGOGASTRODUODENOSCOPY: SHX5428

## 2024-02-16 SURGERY — EGD (ESOPHAGOGASTRODUODENOSCOPY)
Anesthesia: Monitor Anesthesia Care

## 2024-02-16 MED ORDER — SODIUM CHLORIDE 0.9 % IV SOLN: INTRAVENOUS | Status: DC

## 2024-02-16 MED ORDER — PROPOFOL 500 MG/50ML IV EMUL
INTRAVENOUS | Status: DC | PRN
  Administered 2024-02-16: 125 ug/kg/min via INTRAVENOUS

## 2024-02-16 MED ORDER — PROPOFOL 10 MG/ML IV BOLUS
INTRAVENOUS | Status: DC | PRN
  Administered 2024-02-16: 60 mg via INTRAVENOUS
  Administered 2024-02-16: 40 mg via INTRAVENOUS
  Administered 2024-02-16: 30 mg via INTRAVENOUS

## 2024-02-16 NOTE — Discharge Instructions (Signed)

## 2024-02-16 NOTE — Transfer of Care (Signed)
 Immediate Anesthesia Transfer of Care Note  Patient: Kathleen Bender  Procedure(s) Performed: EGD (ESOPHAGOGASTRODUODENOSCOPY) BALLOON DILATION  Patient Location: PACU  Anesthesia Type:MAC  Level of Consciousness: drowsy and patient cooperative  Airway & Oxygen Therapy: Patient Spontanous Breathing  Post-op Assessment: Report given to RN and Post -op Vital signs reviewed and stable  Post vital signs: Reviewed and stable  Last Vitals:  Vitals Value Taken Time  BP 105/69 02/16/24 08:07  Temp    Pulse 79 02/16/24 08:09  Resp 15 02/16/24 08:09  SpO2 100 % 02/16/24 08:09  Vitals shown include unfiled device data.  Last Pain:  Vitals:   02/16/24 0807  TempSrc: Temporal  PainSc: 0-No pain         Complications: No notable events documented.

## 2024-02-16 NOTE — H&P (Signed)
 GASTROENTEROLOGY PROCEDURE H&P NOTE   Primary Care Physician: Shayne Anes, MD    Reason for Procedure:   F/u esophagitis and for therapy for esophageal and pyloric stensosis  Plan:    EGD with dilation   Patient is appropriate for endoscopic procedure(s) in the outpt hospital setting.  The nature of the procedure, as well as the risks, benefits, and alternatives were carefully and thoroughly reviewed with the patient. Ample time for discussion and questions allowed. The patient understood, was satisfied, and agreed to proceed.     HPI: Kathleen Bender is a 48 y.o. adult who presents for EGD.  Medical history as below.  No recent chest pain or shortness of breath.  No abdominal pain today.  Past Medical History:  Diagnosis Date   Anemia    Iron Deficiency r/t fibroids   Glaucoma     Past Surgical History:  Procedure Laterality Date   CESAREAN SECTION  2009   with tubal ligation   HYSTERECTOMY ABDOMINAL WITH SALPINGECTOMY Bilateral 10/16/2022   Procedure: HYSTERECTOMY ABDOMINAL WITH SALPINGECTOMY;  Surgeon: Barbette Knock, MD;  Location: [facility] OR;  Service: Gynecology;  Laterality: Bilateral;   KNEE SURGERY Right    48 years old   TUBAL LIGATION  2009    Prior to Admission medications   Medication Sig Start Date End Date Taking? Authorizing Provider  acetaminophen  (TYLENOL ) 500 MG tablet Take 1,000 mg by mouth every 6 (six) hours as needed (pain.). Patient taking differently: Take 1,000 mg by mouth as needed (pain.).   Yes [provider]  Cholecalciferol (VITAMIN D-3 PO) Take 1 capsule by mouth daily.   Yes [provider]  latanoprost (XALATAN) 0.005 % ophthalmic solution Place 1 drop into both eyes at bedtime. 10/25/23  Yes [provider]  Multiple Vitamin (MULTIVITAMIN WITH MINERALS) TABS tablet Take 1 tablet by mouth in the morning.   Yes [provider]  Olopatadine HCl (PATADAY OP) Place 1-2 drops into both eyes at bedtime as  needed (allergy/irritated eyes).   Yes [provider]  pantoprazole  (PROTONIX ) 40 MG tablet Take 1 tablet (40 mg total) by mouth 2 (two) times daily. 11/06/23  Yes Shailee Foots, Gordy HERO, MD    Current Facility-Administered Medications  Medication Dose Route Frequency Provider Last Rate Last Admin   0.9 %  sodium chloride  infusion   Intravenous Continuous Jaelyne Deeg, Gordy HERO, MD        Allergies as of 12/11/2023 - Review Complete 11/06/2023  Allergen Reaction Noted   Nsaids Nausea And Vomiting 03/31/2017    Family History  Problem Relation Age of Onset   Hyperlipidemia Mother    Depression Mother    Stroke Father    Arthritis Father    Depression Sister    ADD / ADHD Sister    Alzheimer's disease Maternal Grandmother    Heart attack Maternal Grandfather    Stroke Paternal Grandmother    Colon cancer Neg Hx    Esophageal cancer Neg Hx    Rectal cancer Neg Hx    Stomach cancer Neg Hx     Social History   Socioeconomic History   Marital status: Married    Spouse name: Not on file   Number of children: 3   Years of education: Not on file   Highest education level: Not on file  Occupational History   Not on file  Tobacco Use   Smoking status: Never   Smokeless tobacco: Never  Vaping Use   Vaping status: Never Used  Substance and Sexual Activity   Alcohol use: Yes    Comment: occasinal   Drug use: Never   Sexual activity: Yes  Other Topics Concern   Not on file  Social History Narrative   Not on file   Social Drivers of Health   Financial Resource Strain: Not on file  Food Insecurity: Not on file  Transportation Needs: Not on file  Physical Activity: Not on file  Stress: Not on file  Social Connections: Not on file  Intimate Partner Violence: Not on file    Physical Exam: Vital signs in last 24 hours: @BP  (!) 146/73   Pulse 82   Temp (!) 97.4 F (36.3 C) (Temporal)   Resp (!) 24   Ht 5' 6 (1.676 m)   Wt 69.9 kg   LMP 08/19/2022 (Approximate)   SpO2  100%   BMI 24.86 kg/m  GEN: NAD EYE: Sclerae anicteric ENT: MMM CV: Non-tachycardic Pulm: CTA b/l GI: Soft, NT/ND NEURO:  Alert & Oriented x 3   Gordy Starch, MD [facility]  02/16/2024 7:29 AM

## 2024-02-16 NOTE — Anesthesia Postprocedure Evaluation (Signed)
 Anesthesia Post Note  Patient: Kathleen Bender  Procedure(s) Performed: EGD (ESOPHAGOGASTRODUODENOSCOPY) BALLOON DILATION     Patient location during evaluation: Endoscopy Anesthesia Type: MAC Level of consciousness: awake and alert Pain management: pain level controlled Vital Signs Assessment: post-procedure vital signs reviewed and stable Respiratory status: spontaneous breathing, nonlabored ventilation, respiratory function stable and patient connected to nasal cannula oxygen Cardiovascular status: stable and blood pressure returned to baseline Postop Assessment: no apparent nausea or vomiting Anesthetic complications: no   No notable events documented.  Last Vitals:  Vitals:   02/16/24 0810 02/16/24 0820  BP: 106/76 128/74  Pulse: 75 72  Resp: 14 16  Temp:    SpO2: 99% 100%    Last Pain:  Vitals:   02/16/24 0820  TempSrc:   PainSc: 0-No pain                 Danecia Underdown L Reality Dejonge

## 2024-02-16 NOTE — Op Note (Signed)
 [facility] Patient Name: Kathleen Bender Procedure Date: 02/16/2024 MRN: [id number] Attending MD: Gordy CHRISTELLA Starch , MD, [id number] Date of Birth: [birth year] CSN: [id number] Age: 48 Admit Type: Outpatient Procedure:                Upper GI endoscopy Indications:              Follow-up of reflux esophagitis and dysphagia, For                            therapy of pyloric stenosis; see EGD report June                            2025 (proximal esophageal stricture associated with                            inlet patch, distal esophagitis, retained food in                            stomach, pyloric stenosis); resolution of GERD                            symptoms and dysphagia with PPI, no recent nausea                            or vomiting Providers:                Gordy CHRISTELLA. Starch, MD, Ozell Pouch, Ritta Debbie Alert, RN, Farris Southgate, Technician Referring MD:             Oneil LABOR. Perini, MD Medicines:                Monitored Anesthesia Care Complications:            No immediate complications. Estimated Blood Loss:     Estimated blood loss was minimal. Procedure:                Pre-Anesthesia Assessment:                           - Prior to the procedure, a History and Physical                            was performed, and patient medications and                            allergies were reviewed. The patient's tolerance of                            previous anesthesia was also reviewed. The risks                            and benefits of the procedure and the sedation  options and risks were discussed with the patient.                            All questions were answered, and informed consent                            was obtained. Prior Anticoagulants: The patient has                            taken no anticoagulant or antiplatelet agents. ASA                            Grade Assessment: II - A patient  with mild systemic                            disease. After reviewing the risks and benefits,                            the patient was deemed in satisfactory condition to                            undergo the procedure.                           After obtaining informed consent, the endoscope was                            passed under direct vision. Throughout the                            procedure, the patient's blood pressure, pulse, and                            oxygen saturations were monitored continuously. The                            [id number]-[id number] ([id number]) Olympus endoscope was introduced                            through the mouth, and advanced to the second part                            of duodenum. The upper GI endoscopy was                            accomplished without difficulty. The patient                            tolerated the procedure well. Scope In: Scope Out: Findings:      Islands of salmon-colored mucosa were present at 20 cm. Consistent with       inlet patch. There is no inflammation seen today but a partial ring       which is non-obstructing. The maximum longitudinal extent of these  esophageal mucosal changes was 2 cm in length. Biopsies were taken with       a cold forceps for histology.      The exam was otherwise without abnormality.      A 1 cm hiatal hernia was present.      A benign-appearing, intrinsic moderate stenosis was found in the       prepyloric region of the stomach (circumferential). This was traversed       after dilation. A TTS dilator was passed through the scope. Dilation       with a 12-13.5-15 mm pyloric balloon dilator was performed (max 15 mm       after 12 and 13.5 mm balloon caused minimal dilation effect). The       dilation site was examined and showed moderate mucosal disruption and       complete resolution of luminal narrowing. Biopsies were taken with a       cold forceps for Helicobacter pylori testing.       The exam of the stomach was otherwise normal.      The examined duodenum was normal. Impression:               - Salmon-colored mucosa in proximal esophagus                            consistent with inlet patch. Partial and                            nonobstructing ring at this location. Biopsied to                            exclude dysplasia.                           - The examination was otherwise normal. Reflux                            esophagitis has healed completely.                           - 1 cm hiatal hernia.                           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 Dilated to 15 mm with                            balloon with good result. Biopsied to exclude H.                            Pylori infection.                           - Normal examined duodenum. Moderate Sedation:      N/A Recommendation:           - Patient has a contact number available for  emergencies. The signs and symptoms of potential                            delayed complications were discussed with the                            patient. Return to normal activities tomorrow.                            Written discharge instructions were provided to the                            patient.                           - Resume previous diet.                           - Continue present medications. Given history of                            improvement in symptoms and mucosal healing                            continue pantoprazole  40 mg daily.                           - Await pathology results.                           - Office follow-up in 1 year unless symptoms                            warrant earlier appointment. Procedure Code(s):        --- Professional ---                           [phone], Esophagogastroduodenoscopy, flexible,                            transoral; with dilation of gastric/duodenal                             stricture(s) (eg, balloon, bougie)                           43239, 59, Esophagogastroduodenoscopy, flexible,                            transoral; with biopsy, single or multiple Diagnosis Code(s):        --- Professional ---                           K22.89, Other specified disease of esophagus                           K44.9, Diaphragmatic hernia without obstruction  or                            gangrene                           K31.1, Adult hypertrophic pyloric stenosis                           K21.00, Gastro-esophageal reflux disease with                            esophagitis, without bleeding CPT copyright 2022 [employer]. All rights reserved. The codes documented in this report are preliminary and upon coder review may  be revised to meet current compliance requirements. Gordy CHRISTELLA Starch, MD 02/16/2024 8:19:13 AM This report has been signed electronically. Number of Addenda: 0

## 2024-02-17 ENCOUNTER — Ambulatory Visit: Payer: Self-pay | Admitting: Internal Medicine

## 2024-02-17 LAB — SURGICAL PATHOLOGY

## 2024-02-18 ENCOUNTER — Encounter (HOSPITAL_COMMUNITY): Payer: Self-pay | Admitting: Internal Medicine

## 2024-05-02 ENCOUNTER — Encounter: Payer: Self-pay | Admitting: Emergency Medicine

## 2024-05-02 ENCOUNTER — Ambulatory Visit
Admission: EM | Admit: 2024-05-02 | Discharge: 2024-05-02 | Disposition: A | Discharge status: Home / Self Care | Attending: Nurse Practitioner | Admitting: Nurse Practitioner

## 2024-05-02 DIAGNOSIS — J206 Acute bronchitis due to rhinovirus: Secondary | ICD-10-CM

## 2024-05-02 DIAGNOSIS — J22 Unspecified acute lower respiratory infection: Secondary | ICD-10-CM | POA: Diagnosis not present

## 2024-05-02 LAB — POC COVID19/FLU A&B COMBO
Covid Antigen, POC: NEGATIVE
Influenza A Antigen, POC: NEGATIVE
Influenza B Antigen, POC: NEGATIVE

## 2024-05-02 MED ORDER — PREDNISONE 20 MG PO TABS: 40.0000 mg | ORAL_TABLET | Freq: Every day | ORAL | 0 refills | Status: AC

## 2024-05-02 MED ORDER — MUCINEX DM MAXIMUM STRENGTH 60-1200 MG PO TB12: 1.0000 | ORAL_TABLET | Freq: Two times a day (BID) | ORAL | 0 refills | Status: AC

## 2024-05-02 MED ORDER — ALBUTEROL SULFATE HFA 108 (90 BASE) MCG/ACT IN AERS: 1.0000 | INHALATION_SPRAY | RESPIRATORY_TRACT | 0 refills | Status: AC | PRN

## 2024-05-02 MED ORDER — DEXAMETHASONE SOD PHOSPHATE PF 10 MG/ML IJ SOLN
10.0000 mg | Freq: Once | INTRAMUSCULAR | Status: AC
  Administered 2024-05-02: 20:00:00 10 mg via INTRAMUSCULAR

## 2024-05-02 MED ORDER — IPRATROPIUM-ALBUTEROL 0.5-2.5 (3) MG/3ML IN SOLN
3.0000 mL | Freq: Once | RESPIRATORY_TRACT | Status: AC
  Administered 2024-05-02: 20:00:00 3 mL via RESPIRATORY_TRACT

## 2024-05-02 MED ORDER — HYDROCOD POLI-CHLORPHE POLI ER 10-8 MG/5ML PO SUER: 5.0000 mL | Freq: Every evening | ORAL | 0 refills | Status: AC | PRN

## 2024-05-02 MED ORDER — BENZONATATE 200 MG PO CAPS: 200.0000 mg | ORAL_CAPSULE | Freq: Three times a day (TID) | ORAL | 0 refills | Status: AC | PRN

## 2024-05-02 NOTE — ED Triage Notes (Signed)
 Pt states she began feeling tired with body aches Friday. Saturday she developed cough. . Today she began having palpitation, sob,feeling hot and cold and chest tightness.

## 2024-05-02 NOTE — ED Provider Notes (Addendum)
 [facility]    CSN: [id number] Arrival date & time: 05/02/24  1908      History   Chief Complaint Chief Complaint  Patient presents with   Cough   Shortness of Breath   Palpitations    HPI Kathleen Bender is a 48 y.o. adult.   Discussed the use of AI scribe software for clinical note transcription with the patient, who gave verbal consent to proceed.   The patient presents with multiple respiratory and systemic symptoms that began three days ago. She reports a dry, nonproductive cough accompanied by chest pain, shortness of breath, and chest tightness. Associated symptoms include fatigue, body aches, palpitations, and chills, with intermittent sensations of feeling hot and cold but no documented fever. She also reports a dull headache, which she attributes to nasal congestion, and rhinorrhea without sneezing. She has been taking Mucinex -D for cough relief, though the cough has remained nonproductive. She denies nausea, vomiting, or diarrhea. She reports no known sick contacts and denies tobacco use.  The following sections of the patient's history were reviewed and updated as appropriate: allergies, current medications, past family history, past medical history, past social history, past surgical history, and problem list.     Past Medical History:  Diagnosis Date   Anemia    Iron Deficiency r/t fibroids   Glaucoma     Patient Active Problem List   Diagnosis Date Noted   Pyloric stenosis in adult 02/16/2024   Gastroesophageal reflux disease with esophagitis 02/16/2024   Esophageal inlet patch 02/16/2024   Fibroids 10/16/2022   S/P TAH (total abdominal hysterectomy) 10/16/2022    Past Surgical History:  Procedure Laterality Date   BALLOON DILATION N/A 02/16/2024   Procedure: BALLOON DILATION;  Surgeon: Albertus Gordy HERO, MD;  Location: [facility];  Service: Gastroenterology;  Laterality: N/A;   CESAREAN SECTION  2009   with tubal ligation    ESOPHAGOGASTRODUODENOSCOPY N/A 02/16/2024   Procedure: EGD (ESOPHAGOGASTRODUODENOSCOPY);  Surgeon: Albertus Gordy HERO, MD;  Location: [facility];  Service: Gastroenterology;  Laterality: N/A;   HYSTERECTOMY ABDOMINAL WITH SALPINGECTOMY Bilateral 10/16/2022   Procedure: HYSTERECTOMY ABDOMINAL WITH SALPINGECTOMY;  Surgeon: Barbette Knock, MD;  Location: [facility];  Service: Gynecology;  Laterality: Bilateral;   KNEE SURGERY Right    48 years old   TUBAL LIGATION  2009    OB History   No obstetric history on file.      Home Medications    Prior to Admission medications  Medication Sig Start Date End Date Taking? Authorizing Provider  albuterol  (VENTOLIN  HFA) 108 (90 Base) MCG/ACT inhaler Inhale 1-2 puffs into the lungs every 4 (four) hours as needed for wheezing or shortness of breath. 05/02/24  Yes Iola Lukes, FNP  benzonatate  (TESSALON ) 200 MG capsule Take 1 capsule (200 mg total) by mouth 3 (three) times daily as needed for cough. 05/02/24  Yes Tynika Luddy, FNP  chlorpheniramine-HYDROcodone (TUSSIONEX) 10-8 MG/5ML Take 5 mLs by mouth at bedtime as needed for cough. 05/02/24  Yes Iola Lukes, FNP  Dextromethorphan-guaiFENesin  (MUCINEX  DM MAXIMUM STRENGTH) 60-1200 MG TB12 Take 1 tablet by mouth 2 (two) times daily. 05/02/24  Yes Iola Lukes, FNP  predniSONE  (DELTASONE ) 20 MG tablet Take 2 tablets (40 mg total) by mouth daily for 5 days. Start the morning of 05/03/24 05/02/24 05/07/24 Yes Iola Lukes, FNP  acetaminophen  (TYLENOL ) 500 MG tablet Take 1,000 mg by mouth every 6 (six) hours as needed (pain.). Patient taking differently: Take 1,000 mg by mouth as needed (pain.).  [provider]  Cholecalciferol (VITAMIN D-3 PO) Take 1 capsule by mouth daily.    [provider]  latanoprost (XALATAN) 0.005 % ophthalmic solution Place 1 drop into both eyes at bedtime. 10/25/23   [provider]  Multiple Vitamin (MULTIVITAMIN WITH MINERALS) TABS  tablet Take 1 tablet by mouth in the morning.    [provider]  Olopatadine HCl (PATADAY OP) Place 1-2 drops into both eyes at bedtime as needed (allergy/irritated eyes).    [provider]  pantoprazole  (PROTONIX ) 40 MG tablet Take 1 tablet (40 mg total) by mouth 2 (two) times daily. 11/06/23   Pyrtle, Gordy HERO, MD    Family History Family History  Problem Relation Age of Onset   Hyperlipidemia Mother    Depression Mother    Stroke Father    Arthritis Father    Depression Sister    ADD / ADHD Sister    Alzheimer's disease Maternal Grandmother    Heart attack Maternal Grandfather    Stroke Paternal Grandmother    Colon cancer Neg Hx    Esophageal cancer Neg Hx    Rectal cancer Neg Hx    Stomach cancer Neg Hx     Social History Social History[1]   Allergies   Nsaids   Review of Systems Review of Systems  Constitutional:  Positive for chills and fatigue. Negative for fever.  HENT:  Positive for congestion and rhinorrhea. Negative for ear pain, postnasal drip, sneezing and sore throat.   Respiratory:  Positive for cough, chest tightness and shortness of breath.   Cardiovascular:  Positive for palpitations.  Gastrointestinal:  Negative for diarrhea, nausea and vomiting.  Musculoskeletal:  Positive for myalgias.  Neurological:  Positive for headaches.  All other systems reviewed and are negative.    Physical Exam Triage Vital Signs ED Triage Vitals  Encounter Vitals Group     BP 05/02/24 1929 137/78     Girls Systolic BP Percentile --      Girls Diastolic BP Percentile --      Boys Systolic BP Percentile --      Boys Diastolic BP Percentile --      Pulse Rate 05/02/24 1929 (!) 110     Resp 05/02/24 1929 19     Temp 05/02/24 1929 99.6 F (37.6 C)     Temp Source 05/02/24 1929 Oral     SpO2 05/02/24 1929 97 %     Weight --      Height --      Head Circumference --      Peak Flow --      Pain Score 05/02/24 1933 5     Pain Loc --      Pain  Education --      Exclude from Growth Chart --    No data found.  Updated Vital Signs BP 137/78 (BP Location: Right Arm)   Pulse (!) 110   Temp 99.6 F (37.6 C) (Oral)   Resp 19   LMP 08/19/2022   SpO2 97%   Visual Acuity Right Eye Distance:   Left Eye Distance:   Bilateral Distance:    Right Eye Near:   Left Eye Near:    Bilateral Near:     Physical Exam Vitals reviewed.  Constitutional:      General: She is awake. She is not in acute distress.    Appearance: Normal appearance. She is well-developed. She is not ill-appearing, toxic-appearing or diaphoretic.     Comments: Appears uncomfortable  but in no acute distress.   HENT:     Head: Normocephalic.     Right Ear: Tympanic membrane, ear canal and external ear normal. No drainage, swelling or tenderness. No middle ear effusion. Tympanic membrane is not erythematous.     Left Ear: Tympanic membrane, ear canal and external ear normal. No drainage, swelling or tenderness.  No middle ear effusion. Tympanic membrane is not erythematous.     Nose: Congestion present.     Mouth/Throat:     Lips: Pink.     Mouth: Mucous membranes are moist.     Pharynx: No pharyngeal swelling, oropharyngeal exudate, posterior oropharyngeal erythema or uvula swelling.     Tonsils: No tonsillar exudate or tonsillar abscesses.  Eyes:     General: Vision grossly intact.     Conjunctiva/sclera: Conjunctivae normal.  Cardiovascular:     Rate and Rhythm: Normal rate.     Heart sounds: Normal heart sounds.  Pulmonary:     Effort: Pulmonary effort is normal. No tachypnea or respiratory distress.     Breath sounds: Normal breath sounds and air entry. No decreased air movement. No decreased breath sounds or wheezing.     Comments: Persistent dry coughing noted during exam. Lungs clear throughout. Respirations even and unlabored.  Musculoskeletal:        General: Normal range of motion.     Cervical back: Normal range of motion and neck supple.   Lymphadenopathy:     Cervical: No cervical adenopathy.  Skin:    General: Skin is warm and dry.  Neurological:     General: No focal deficit present.     Mental Status: She is alert and oriented to person, place, and time.  Psychiatric:        Behavior: Behavior is cooperative.      UC Treatments / Results  Labs (all labs ordered are listed, but only abnormal results are displayed) Labs Reviewed  POC COVID19/FLU A&B COMBO - Normal    EKG   Radiology No results found.  Procedures ED EKG  Date/Time: 05/02/2024 8:18 PM  Performed by: Iola Lukes, FNP Authorized by: Iola Lukes, FNP   Previous ECG:    Previous ECG comparison: no prior EKG for comparison. Interpretation:    Interpretation: normal   Rate:    ECG rate:  91   ECG rate assessment: normal   Rhythm:    Rhythm: sinus rhythm   Ectopy:    Ectopy: none   QRS:    QRS axis:  Normal   QRS intervals:  Normal   QRS conduction: normal   ST segments:    ST segments:  Normal T waves:    T waves: normal   Q waves:    Abnormal Q-waves: not present    (including critical care time)  Medications Ordered in UC Medications  ipratropium-albuterol  (DUONEB) 0.5-2.5 (3) MG/3ML nebulizer solution 3 mL (3 mLs Nebulization Given 05/02/24 2011)  dexamethasone  (DECADRON ) injection 10 mg (10 mg Intramuscular Given 05/02/24 2009)    Initial Impression / Assessment and Plan / UC Course  I have reviewed the triage vital signs and the nursing notes.  Pertinent labs & imaging results that were available during my care of the patient were reviewed by me and considered in my medical decision making (see chart for details).     The patient presents with three days of respiratory and systemic symptoms, including a dry, nonproductive cough, chest tightness, chest pain, shortness of breath, fatigue, body aches, palpitations, chills,  nasal congestion, and rhinorrhea. She denies gastrointestinal symptoms and has no  known sick contacts. COVID-19 and influenza testing were negative. EKG is normal, lung examination is clear, and vital signs including blood pressure, heart rate, and oxygen saturation are within normal limits. The overall presentation is most consistent with a viral lower respiratory tract infection, likely acute bronchitis, without evidence of bacterial infection or cardiac pathology.  Treatment today focused on symptomatic and anti-inflammatory management, including administration of a steroid injection and a nebulizer treatment to improve chest tightness and respiratory comfort. A short course of oral steroids was prescribed to begin the following morning, along with cough suppression medications including a scheduled cough pill and a nighttime cough syrup as needed. Acetaminophen  was recommended for fever or discomfort, along with rest, hydration, and supportive care measures. The patient was advised to follow up with her primary care provider if symptoms do not improve or worsen over the next several days. Emergency evaluation is recommended for increasing shortness of breath, chest pain, persistent or high fever, dizziness, syncope, or any acute change in symptoms.  Today's evaluation has revealed no signs of a dangerous process. Discussed diagnosis with patient and/or guardian. Patient and/or guardian aware of their diagnosis, possible red flag symptoms to watch out for and need for close follow up. Patient and/or guardian understands verbal and written discharge instructions. Patient and/or guardian comfortable with plan and disposition.  Patient and/or guardian has a clear mental status at this time, good insight into illness (after discussion and teaching) and has clear judgment to make decisions regarding their care  Documentation was completed with the aid of voice recognition software. Transcription may contain typographical errors.  Final Clinical Impressions(s) / UC Diagnoses   Final  diagnoses:  Acute lower respiratory infection  Acute bronchitis due to Rhinovirus     Discharge Instructions      You were seen today for cough, chest tightness, and other respiratory symptoms. Your exam, oxygen level, EKG, and lung sounds were reassuring, and your COVID and flu tests were negative. Your symptoms are most consistent with a viral lower respiratory infection such as bronchitis, which does not require antibiotics and usually improves with time and supportive care.  You were given a steroid injection and a breathing treatment today to help reduce airway inflammation and chest tightness. Start the prescribed oral steroids tomorrow morning as directed. Take the cough medication as prescribed twice daily, and you may use the cough syrup at bedtime if needed for sleep. Acetaminophen  can be used for body aches, headache, or fever. Be sure to rest, drink plenty of fluids, and avoid smoking or exposure to smoke. Consider replacing or sanitizing your toothbrush once you begin feeling better.  Follow up with your primary care provider if symptoms are not improving within several days or if your cough or shortness of breath worsens. Seek emergency care right away if you develop severe or worsening shortness of breath, chest pain that does not improve, dizziness or fainting, persistent high fever, blue lips or fingers, or if you feel suddenly much worse.     ED Prescriptions     Medication Sig Dispense Auth. Provider   predniSONE  (DELTASONE ) 20 MG tablet Take 2 tablets (40 mg total) by mouth daily for 5 days. Start the morning of 05/03/24 10 tablet Iola Lukes, FNP   chlorpheniramine-HYDROcodone (TUSSIONEX) 10-8 MG/5ML Take 5 mLs by mouth at bedtime as needed for cough. 35 mL Iola Lukes, FNP   Dextromethorphan-guaiFENesin  (MUCINEX  DM MAXIMUM STRENGTH) 60-1200 MG  TB12 Take 1 tablet by mouth 2 (two) times daily. 20 tablet Iola Lukes, FNP   benzonatate  (TESSALON ) 200 MG  capsule Take 1 capsule (200 mg total) by mouth 3 (three) times daily as needed for cough. 30 capsule Iola Lukes, FNP   albuterol  (VENTOLIN  HFA) 108 (90 Base) MCG/ACT inhaler Inhale 1-2 puffs into the lungs every 4 (four) hours as needed for wheezing or shortness of breath. 18 g Iola Lukes, FNP      I have reviewed the PDMP during this encounter.    Iola Lukes, [place] 05/02/24 2016     [1]  Social History Tobacco Use   Smoking status: Never   Smokeless tobacco: Never  Vaping Use   Vaping status: Never Used  Substance Use Topics   Alcohol use: Yes    Comment: occasinal   Drug use: Never     Iola Lukes, FNP 05/02/24 2018

## 2024-05-02 NOTE — Discharge Instructions (Addendum)
 You were seen today for cough, chest tightness, and other respiratory symptoms. Your exam, oxygen level, EKG, and lung sounds were reassuring, and your COVID and flu tests were negative. Your symptoms are most consistent with a viral lower respiratory infection such as bronchitis, which does not require antibiotics and usually improves with time and supportive care.  You were given a steroid injection and a breathing treatment today to help reduce airway inflammation and chest tightness. Start the prescribed oral steroids tomorrow morning as directed. Take the cough medication as prescribed twice daily, and you may use the cough syrup at bedtime if needed for sleep. Acetaminophen  can be used for body aches, headache, or fever. Be sure to rest, drink plenty of fluids, and avoid smoking or exposure to smoke. Consider replacing or sanitizing your toothbrush once you begin feeling better.  Follow up with your primary care provider if symptoms are not improving within several days or if your cough or shortness of breath worsens. Seek emergency care right away if you develop severe or worsening shortness of breath, chest pain that does not improve, dizziness or fainting, persistent high fever, blue lips or fingers, or if you feel suddenly much worse.
